# Patient Record
Sex: Female | Born: 1965 | Race: White | Hispanic: No | Smoking: Former smoker
Health system: Southern US, Community
[De-identification: ages and names within clinical notes are randomized; demographics above are authoritative.]

## PROBLEM LIST (undated history)

## (undated) DIAGNOSIS — Z89411 Acquired absence of right great toe: Secondary | ICD-10-CM

## (undated) DIAGNOSIS — E119 Type 2 diabetes mellitus without complications: Secondary | ICD-10-CM

## (undated) DIAGNOSIS — R259 Unspecified abnormal involuntary movements: Secondary | ICD-10-CM

## (undated) DIAGNOSIS — B019 Varicella without complication: Secondary | ICD-10-CM

## (undated) DIAGNOSIS — E785 Hyperlipidemia, unspecified: Secondary | ICD-10-CM

## (undated) DIAGNOSIS — E079 Disorder of thyroid, unspecified: Secondary | ICD-10-CM

## (undated) DIAGNOSIS — Z89519 Acquired absence of unspecified leg below knee: Secondary | ICD-10-CM

## (undated) DIAGNOSIS — I739 Peripheral vascular disease, unspecified: Secondary | ICD-10-CM

## (undated) DIAGNOSIS — Z0000 Encounter for general adult medical examination without abnormal findings: Secondary | ICD-10-CM

## (undated) DIAGNOSIS — IMO0002 Reserved for concepts with insufficient information to code with codable children: Secondary | ICD-10-CM

## (undated) DIAGNOSIS — Z89619 Acquired absence of unspecified leg above knee: Secondary | ICD-10-CM

## (undated) DIAGNOSIS — Z89429 Acquired absence of other toe(s), unspecified side: Secondary | ICD-10-CM

## (undated) DIAGNOSIS — N390 Urinary tract infection, site not specified: Secondary | ICD-10-CM

## (undated) DIAGNOSIS — E282 Polycystic ovarian syndrome: Secondary | ICD-10-CM

## (undated) HISTORY — DX: Type 2 diabetes mellitus without complications: E11.9

## (undated) HISTORY — DX: Acquired absence of unspecified leg below knee: Z89.519

## (undated) HISTORY — DX: Acquired absence of right great toe: Z89.411

## (undated) HISTORY — DX: Hyperlipidemia, unspecified: E78.5

## (undated) HISTORY — DX: Reserved for concepts with insufficient information to code with codable children: IMO0002

## (undated) HISTORY — DX: Encounter for general adult medical examination without abnormal findings: Z00.00

## (undated) HISTORY — DX: Peripheral vascular disease, unspecified: I73.9

## (undated) HISTORY — DX: Unspecified abnormal involuntary movements: R25.9

## (undated) HISTORY — DX: Acquired absence of other toe(s), unspecified side: Z89.429

## (undated) HISTORY — DX: Disorder of thyroid, unspecified: E07.9

## (undated) HISTORY — DX: Acquired absence of unspecified leg above knee: Z89.619

## (undated) HISTORY — DX: Urinary tract infection, site not specified: N39.0

## (undated) HISTORY — DX: Varicella without complication: B01.9

## (undated) HISTORY — DX: Polycystic ovarian syndrome: E28.2

## (undated) HISTORY — PX: VASCULAR SURGERY: SHX849

---

## 1997-10-03 DIAGNOSIS — E282 Polycystic ovarian syndrome: Secondary | ICD-10-CM

## 1997-10-03 HISTORY — PX: ABDOMINAL EXPLORATION SURGERY: SHX538

## 1997-10-03 HISTORY — DX: Polycystic ovarian syndrome: E28.2

## 1997-10-03 LAB — HM PAP SMEAR

## 1998-05-28 HISTORY — PX: ABDOMINAL HYSTERECTOMY: SHX81

## 2011-11-04 LAB — HM MAMMOGRAPHY: HM MAMMO: NORMAL

## 2013-10-08 ENCOUNTER — Ambulatory Visit: Payer: Self-pay | Admitting: Family Medicine

## 2013-10-11 ENCOUNTER — Encounter: Payer: Self-pay | Admitting: Family Medicine

## 2013-10-11 ENCOUNTER — Ambulatory Visit (INDEPENDENT_AMBULATORY_CARE_PROVIDER_SITE_OTHER): Payer: Managed Care, Other (non HMO) | Admitting: Family Medicine

## 2013-10-11 ENCOUNTER — Telehealth: Payer: Self-pay | Admitting: Family Medicine

## 2013-10-11 VITALS — BP 118/80 | HR 84 | Temp 98.2°F | Ht 62.75 in | Wt 181.1 lb

## 2013-10-11 DIAGNOSIS — K219 Gastro-esophageal reflux disease without esophagitis: Secondary | ICD-10-CM

## 2013-10-11 DIAGNOSIS — E282 Polycystic ovarian syndrome: Secondary | ICD-10-CM | POA: Insufficient documentation

## 2013-10-11 DIAGNOSIS — E079 Disorder of thyroid, unspecified: Secondary | ICD-10-CM

## 2013-10-11 DIAGNOSIS — E119 Type 2 diabetes mellitus without complications: Secondary | ICD-10-CM

## 2013-10-11 DIAGNOSIS — E785 Hyperlipidemia, unspecified: Secondary | ICD-10-CM

## 2013-10-11 DIAGNOSIS — B019 Varicella without complication: Secondary | ICD-10-CM | POA: Insufficient documentation

## 2013-10-11 DIAGNOSIS — M549 Dorsalgia, unspecified: Secondary | ICD-10-CM

## 2013-10-11 DIAGNOSIS — IMO0002 Reserved for concepts with insufficient information to code with codable children: Secondary | ICD-10-CM | POA: Insufficient documentation

## 2013-10-11 MED ORDER — INSULIN ASPART PROT & ASPART (70-30 MIX) 100 UNIT/ML PEN: 60.0000 [IU] | PEN_INJECTOR | Freq: Every day | SUBCUTANEOUS | Status: DC

## 2013-10-11 MED ORDER — HYDROCODONE-ACETAMINOPHEN 10-325 MG PO TABS: 1.0000 | ORAL_TABLET | Freq: Two times a day (BID) | ORAL | Status: DC | PRN

## 2013-10-11 MED ORDER — METFORMIN HCL ER (OSM) 1000 MG PO TB24: 1000.0000 mg | ORAL_TABLET | Freq: Two times a day (BID) | ORAL | Status: DC

## 2013-10-11 MED ORDER — DEXLANSOPRAZOLE 60 MG PO CPDR: 60.0000 mg | DELAYED_RELEASE_CAPSULE | Freq: Every day | ORAL | Status: DC

## 2013-10-11 NOTE — Telephone Encounter (Signed)
Next visit gyn, labs prior lipid, renal, cbc, tsh, hepatic, hgba1c, vitamin d at [facility] [place]

## 2013-10-11 NOTE — Patient Instructions (Signed)
Probiotic such as Digestive Advantage, Culturelle, Florastor, Align or a generic yogurt    Preventive Care for Adults, Female A healthy lifestyle and preventive care can promote health and wellness. Preventive health guidelines for women include the following key practices.  A routine yearly physical is a good way to check with your caregiver about your health and preventive screening. It is a chance to share any concerns and updates on your health, and to receive a thorough exam.  Visit your dentist for a routine exam and preventive care every 6 months. Brush your teeth twice a day and floss once a day. Good oral hygiene prevents tooth decay and gum disease.  The frequency of eye exams is based on your age, health, family medical history, use of contact lenses, and other factors. Follow your caregiver's recommendations for frequency of eye exams.  Eat a healthy diet. Foods like vegetables, fruits, whole grains, low-fat dairy products, and lean protein foods contain the nutrients you need without too many calories. Decrease your intake of foods high in solid fats, added sugars, and salt. Eat the right amount of calories for you.Get information about a proper diet from your caregiver, if necessary.  Regular physical exercise is one of the most important things you can do for your health. Most adults should get at least 150 minutes of moderate-intensity exercise (any activity that increases your heart rate and causes you to sweat) each week. In addition, most adults need muscle-strengthening exercises on 2 or more days a week.  Maintain a healthy weight. The body mass index (BMI) is a screening tool to identify possible weight problems. It provides an estimate of body fat based on height and weight. Your caregiver can help determine your BMI, and can help you achieve or maintain a healthy weight.For adults 20 years and older:  A BMI below 18.5 is considered underweight.  A BMI of 18.5 to 24.9  is normal.  A BMI of 25 to 29.9 is considered overweight.  A BMI of 30 and above is considered obese.  Maintain normal blood lipids and cholesterol levels by exercising and minimizing your intake of saturated fat. Eat a balanced diet with plenty of fruit and vegetables. Blood tests for lipids and cholesterol should begin at age 30 and be repeated every 5 years. If your lipid or cholesterol levels are high, you are over 50, or you are at high risk for heart disease, you may need your cholesterol levels checked more frequently.Ongoing high lipid and cholesterol levels should be treated with medicines if diet and exercise are not effective.  If you smoke, find out from your caregiver how to quit. If you do not use tobacco, do not start.  Lung cancer screening is recommended for adults aged 16 80 years who are at high risk for developing lung cancer because of a history of smoking. Yearly low-dose computed tomography (CT) is recommended for people who have at least a 30-pack-year history of smoking and are a current smoker or have quit within the past 15 years. A pack year of smoking is smoking an average of 1 pack of cigarettes a day for 1 year (for example: 1 pack a day for 30 years or 2 packs a day for 15 years). Yearly screening should continue until the smoker has stopped smoking for at least 15 years. Yearly screening should also be stopped for people who develop a health problem that would prevent them from having lung cancer treatment.  If you are pregnant, do not  drink alcohol. If you are breastfeeding, be very cautious about drinking alcohol. If you are not pregnant and choose to drink alcohol, do not exceed 1 drink per day. One drink is considered to be 12 ounces (355 mL) of beer, 5 ounces (148 mL) of wine, or 1.5 ounces (44 mL) of liquor.  Avoid use of street drugs. Do not share needles with anyone. Ask for help if you need support or instructions about stopping the use of drugs.  High blood  pressure causes heart disease and increases the risk of stroke. Your blood pressure should be checked at least every 1 to 2 years. Ongoing high blood pressure should be treated with medicines if weight loss and exercise are not effective.  If you are 38 to 50 years old, ask your caregiver if you should take aspirin to prevent strokes.  Diabetes screening involves taking a blood sample to check your fasting blood sugar level. This should be done once every 3 years, after age 71, if you are within normal weight and without risk factors for diabetes. Testing should be considered at a younger age or be carried out more frequently if you are overweight and have at least 1 risk factor for diabetes.  Breast cancer screening is essential preventive care for women. You should practice "breast self-awareness." This means understanding the normal appearance and feel of your breasts and may include breast self-examination. Any changes detected, no matter how small, should be reported to a caregiver. Women in their 73s and 30s should have a clinical breast exam (CBE) by a caregiver as part of a regular health exam every 1 to 3 years. After age 21, women should have a CBE every year. Starting at age 37, women should consider having a mammography (breast X-ray test) every year. Women who have a family history of breast cancer should talk to their caregiver about genetic screening. Women at a high risk of breast cancer should talk to their caregivers about having magnetic resonance imaging (MRI) and a mammography every year.  Breast cancer gene (BRCA)-related cancer risk assessment is recommended for women who have family members with BRCA-related cancers. BRCA-related cancers include breast, ovarian, tubal, and peritoneal cancers. Having family members with these cancers may be associated with an increased risk for harmful changes (mutations) in the breast cancer genes BRCA1 and BRCA2. Results of the assessment will  determine the need for genetic counseling and BRCA1 and BRCA2 testing.  The Pap test is a screening test for cervical cancer. A Pap test can show cell changes on the cervix that might become cervical cancer if left untreated. A Pap test is a procedure in which cells are obtained and examined from the lower end of the uterus (cervix).  Women should have a Pap test starting at age 49.  Between ages 59 and 32, Pap tests should be repeated every 2 years.  Beginning at age 40, you should have a Pap test every 3 years as long as the past 3 Pap tests have been normal.  Some women have medical problems that increase the chance of getting cervical cancer. Talk to your caregiver about these problems. It is especially important to talk to your caregiver if a new problem develops soon after your last Pap test. In these cases, your caregiver may recommend more frequent screening and Pap tests.  The above recommendations are the same for women who have or have not gotten the vaccine for human papillomavirus (HPV).  If you had a hysterectomy for  a problem that was not cancer or a condition that could lead to cancer, then you no longer need Pap tests. Even if you no longer need a Pap test, a regular exam is a good idea to make sure no other problems are starting.  If you are between ages 30 and 65, and you have had normal Pap tests going back 10 years, you no longer need Pap tests. Even if you no longer need a Pap test, a regular exam is a good idea to make sure no other problems are starting.  If you have had past treatment for cervical cancer or a condition that could lead to cancer, you need Pap tests and screening for cancer for at least 20 years after your treatment.  If Pap tests have been discontinued, risk factors (such as a new sexual partner) need to be reassessed to determine if screening should be resumed.  The HPV test is an additional test that may be used for cervical cancer screening. The HPV  test looks for the virus that can cause the cell changes on the cervix. The cells collected during the Pap test can be tested for HPV. The HPV test could be used to screen women aged 33 years and older, and should be used in women of any age who have unclear Pap test results. After the age of 37, women should have HPV testing at the same frequency as a Pap test.  Colorectal cancer can be detected and often prevented. Most routine colorectal cancer screening begins at the age of 38 and continues through age 75. However, your caregiver may recommend screening at an earlier age if you have risk factors for colon cancer. On a yearly basis, your caregiver may provide home test kits to check for hidden blood in the stool. Use of a small camera at the end of a tube, to directly examine the colon (sigmoidoscopy or colonoscopy), can detect the earliest forms of colorectal cancer. Talk to your caregiver about this at age 10, when routine screening begins. Direct examination of the colon should be repeated every 5 to 10 years through age 77, unless early forms of pre-cancerous polyps or small growths are found.  Hepatitis C blood testing is recommended for all people born from [birth year] through 1965 and any individual with known risks for hepatitis C.  Practice safe sex. Use condoms and avoid high-risk sexual practices to reduce the spread of sexually transmitted infections (STIs). STIs include gonorrhea, chlamydia, syphilis, trichomonas, herpes, HPV, and human immunodeficiency virus (HIV). Herpes, HIV, and HPV are viral illnesses that have no cure. They can result in disability, cancer, and death. Sexually active women aged 41 and younger should be checked for chlamydia. Older women with new or multiple partners should also be tested for chlamydia. Testing for other STIs is recommended if you are sexually active and at increased risk.  Osteoporosis is a disease in which the bones lose minerals and strength with aging.  This can result in serious bone fractures. The risk of osteoporosis can be identified using a bone density scan. Women ages 61 and over and women at risk for fractures or osteoporosis should discuss screening with their caregivers. Ask your caregiver whether you should take a calcium supplement or vitamin D to reduce the rate of osteoporosis.  Menopause can be associated with physical symptoms and risks. Hormone replacement therapy is available to decrease symptoms and risks. You should talk to your caregiver about whether hormone replacement therapy is right for you.  Use sunscreen. Apply sunscreen liberally and repeatedly throughout the day. You should seek shade when your shadow is shorter than you. Protect yourself by wearing long sleeves, pants, a wide-brimmed hat, and sunglasses year round, whenever you are outdoors.  Once a month, do a whole body skin exam, using a mirror to look at the skin on your back. Notify your caregiver of new moles, moles that have irregular borders, moles that are larger than a pencil eraser, or moles that have changed in shape or color.  Stay current with required immunizations.  Influenza vaccine. All adults should be immunized every year.  Tetanus, diphtheria, and acellular pertussis (Td, Tdap) vaccine. Pregnant women should receive 1 dose of Tdap vaccine during each pregnancy. The dose should be obtained regardless of the length of time since the last dose. Immunization is preferred during the 27th to 36th week of gestation. An adult who has not previously received Tdap or who does not know her vaccine status should receive 1 dose of Tdap. This initial dose should be followed by tetanus and diphtheria toxoids (Td) booster doses every 10 years. Adults with an unknown or incomplete history of completing a 3-dose immunization series with Td-containing vaccines should begin or complete a primary immunization series including a Tdap dose. Adults should receive a Td booster  every 10 years.  Varicella vaccine. An adult without evidence of immunity to varicella should receive 2 doses or a second dose if she has previously received 1 dose. Pregnant females who do not have evidence of immunity should receive the first dose after pregnancy. This first dose should be obtained before leaving the health care facility. The second dose should be obtained 4 8 weeks after the first dose.  Human papillomavirus (HPV) vaccine. Females aged 47 26 years who have not received the vaccine previously should obtain the 3-dose series. The vaccine is not recommended for use in pregnant females. However, pregnancy testing is not needed before receiving a dose. If a female is found to be pregnant after receiving a dose, no treatment is needed. In that case, the remaining doses should be delayed until after the pregnancy. Immunization is recommended for any person with an immunocompromised condition through the age of 76 years if she did not get any or all doses earlier. During the 3-dose series, the second dose should be obtained 4 8 weeks after the first dose. The third dose should be obtained 24 weeks after the first dose and 16 weeks after the second dose.  Zoster vaccine. One dose is recommended for adults aged 42 years or older unless certain conditions are present.  Measles, mumps, and rubella (MMR) vaccine. Adults born before [birth year] generally are considered immune to measles and mumps. Adults born in [birth year] or later should have 1 or more doses of MMR vaccine unless there is a contraindication to the vaccine or there is laboratory evidence of immunity to each of the three diseases. A routine second dose of MMR vaccine should be obtained at least 28 days after the first dose for students attending postsecondary schools, health care workers, or international travelers. People who received inactivated measles vaccine or an unknown type of measles vaccine during 1963 1967 should receive 2 doses of MMR  vaccine. People who received inactivated mumps vaccine or an unknown type of mumps vaccine before 1979 and are at high risk for mumps infection should consider immunization with 2 doses of MMR vaccine. For females of childbearing age, rubella immunity should be determined. If there is  no evidence of immunity, females who are not pregnant should be vaccinated. If there is no evidence of immunity, females who are pregnant should delay immunization until after pregnancy. Unvaccinated health care workers born before [birth year] who lack laboratory evidence of measles, mumps, or rubella immunity or laboratory confirmation of disease should consider measles and mumps immunization with 2 doses of MMR vaccine or rubella immunization with 1 dose of MMR vaccine.  Pneumococcal 13-valent conjugate (PCV13) vaccine. When indicated, a person who is uncertain of her immunization history and has no record of immunization should receive the PCV13 vaccine. An adult aged 74 years or older who has certain medical conditions and has not been previously immunized should receive 1 dose of PCV13 vaccine. This PCV13 should be followed with a dose of pneumococcal polysaccharide (PPSV23) vaccine. The PPSV23 vaccine dose should be obtained at least 8 weeks after the dose of PCV13 vaccine. An adult aged 33 years or older who has certain medical conditions and previously received 1 or more doses of PPSV23 vaccine should receive 1 dose of PCV13. The PCV13 vaccine dose should be obtained 1 or more years after the last PPSV23 vaccine dose.  Pneumococcal polysaccharide (PPSV23) vaccine. When PCV13 is also indicated, PCV13 should be obtained first. All adults aged 20 years and older should be immunized. An adult younger than age 35 years who has certain medical conditions should be immunized. Any person who resides in a nursing home or long-term care facility should be immunized. An adult smoker should be immunized. People with an immunocompromised  condition and certain other conditions should receive both PCV13 and PPSV23 vaccines. People with human immunodeficiency virus (HIV) infection should be immunized as soon as possible after diagnosis. Immunization during chemotherapy or radiation therapy should be avoided. Routine use of PPSV23 vaccine is not recommended for American Indians, [place] Natives, or people younger than 65 years unless there are medical conditions that require PPSV23 vaccine. When indicated, people who have unknown immunization and have no record of immunization should receive PPSV23 vaccine. One-time revaccination 5 years after the first dose of PPSV23 is recommended for people aged 42 64 years who have chronic kidney failure, nephrotic syndrome, asplenia, or immunocompromised conditions. People who received 1 2 doses of PPSV23 before age 79 years should receive another dose of PPSV23 vaccine at age 45 years or later if at least 5 years have passed since the previous dose. Doses of PPSV23 are not needed for people immunized with PPSV23 at or after age 49 years.  Meningococcal vaccine. Adults with asplenia or persistent complement component deficiencies should receive 2 doses of quadrivalent meningococcal conjugate (MenACWY-D) vaccine. The doses should be obtained at least 2 months apart. Microbiologists working with certain meningococcal bacteria, [place] recruits, people at risk during an outbreak, and people who travel to or live in countries with a high rate of meningitis should be immunized. A first-year college student up through age 35 years who is living in a residence hall should receive a dose if she did not receive a dose on or after her 16th birthday. Adults who have certain high-risk conditions should receive one or more doses of vaccine.  Hepatitis A vaccine. Adults who wish to be protected from this disease, have certain high-risk conditions, work with hepatitis A-infected animals, work in hepatitis A research labs, or  travel to or work in countries with a high rate of hepatitis A should be immunized. Adults who were previously unvaccinated and who anticipate close contact with an international adoptee  during the first 60 days after arrival in the [place] from a country with a high rate of hepatitis A should be immunized.  Hepatitis B vaccine. Adults who wish to be protected from this disease, have certain high-risk conditions, may be exposed to blood or other infectious body fluids, are household contacts or sex partners of hepatitis B positive people, are clients or workers in certain care facilities, or travel to or work in countries with a high rate of hepatitis B should be immunized.  Haemophilus influenzae type b (Hib) vaccine. A previously unvaccinated person with asplenia or sickle cell disease or having a scheduled splenectomy should receive 1 dose of Hib vaccine. Regardless of previous immunization, a recipient of a hematopoietic stem cell transplant should receive a 3-dose series 6 12 months after her successful transplant. Hib vaccine is not recommended for adults with HIV infection. Preventive Services / Frequency Ages 39 to 72  Blood pressure check.** / Every 1 to 2 years.  Lipid and cholesterol check.** / Every 5 years beginning at age 92.  Clinical breast exam.** / Every 3 years for women in their 66s and 50s.  BRCA-related cancer risk assessment.** / For women who have family members with a BRCA-related cancer (breast, ovarian, tubal, or peritoneal cancers).  Pap test.** / Every 2 years from ages 47 through 3. Every 3 years starting at age 50 through age 32 or 44 with a history of 3 consecutive normal Pap tests.  HPV screening.** / Every 3 years from ages 28 through ages 77 to 68 with a history of 3 consecutive normal Pap tests.  Hepatitis C blood test.** / For any individual with known risks for hepatitis C.  Skin self-exam. / Monthly.  Influenza vaccine. / Every year.  Tetanus,  diphtheria, and acellular pertussis (Tdap, Td) vaccine.** / Consult your caregiver. Pregnant women should receive 1 dose of Tdap vaccine during each pregnancy. 1 dose of Td every 10 years.  Varicella vaccine.** / Consult your caregiver. Pregnant females who do not have evidence of immunity should receive the first dose after pregnancy.  HPV vaccine. / 3 doses over 6 months, if 2 and younger. The vaccine is not recommended for use in pregnant females. However, pregnancy testing is not needed before receiving a dose.  Measles, mumps, rubella (MMR) vaccine.** / You need at least 1 dose of MMR if you were born in 1957 or later. You may also need a 2nd dose. For females of childbearing age, rubella immunity should be determined. If there is no evidence of immunity, females who are not pregnant should be vaccinated. If there is no evidence of immunity, females who are pregnant should delay immunization until after pregnancy.  Pneumococcal 13-valent conjugate (PCV13) vaccine.** / Consult your caregiver.  Pneumococcal polysaccharide (PPSV23) vaccine.** / 1 to 2 doses if you smoke cigarettes or if you have certain conditions.  Meningococcal vaccine.** / 1 dose if you are age 31 to 87 years and a Market researcher living in a residence hall, or have one of several medical conditions, you need to get vaccinated against meningococcal disease. You may also need additional booster doses.  Hepatitis A vaccine.** / Consult your caregiver.  Hepatitis B vaccine.** / Consult your caregiver.  Haemophilus influenzae type b (Hib) vaccine.** / Consult your caregiver. Ages 59 to 58  Blood pressure check.** / Every 1 to 2 years.  Lipid and cholesterol check.** / Every 5 years beginning at age 79.  Lung cancer screening. / Every year if  you are aged 46 80 years and have a 30-pack-year history of smoking and currently smoke or have quit within the past 15 years. Yearly screening is stopped once you have quit  smoking for at least 15 years or develop a health problem that would prevent you from having lung cancer treatment.  Clinical breast exam.** / Every year after age 36.  BRCA-related cancer risk assessment.** / For women who have family members with a BRCA-related cancer (breast, ovarian, tubal, or peritoneal cancers).  Mammogram.** / Every year beginning at age 18 and continuing for as long as you are in good health. Consult with your caregiver.  Pap test.** / Every 3 years starting at age 77 through age 97 or 88 with a history of 3 consecutive normal Pap tests.  HPV screening.** / Every 3 years from ages 18 through ages 40 to 29 with a history of 3 consecutive normal Pap tests.  Fecal occult blood test (FOBT) of stool. / Every year beginning at age 88 and continuing until age 60. You may not need to do this test if you get a colonoscopy every 10 years.  Flexible sigmoidoscopy or colonoscopy.** / Every 5 years for a flexible sigmoidoscopy or every 10 years for a colonoscopy beginning at age 43 and continuing until age 69.  Hepatitis C blood test.** / For all people born from [birth year] through 1967 and any individual with known risks for hepatitis C.  Skin self-exam. / Monthly.  Influenza vaccine. / Every year.  Tetanus, diphtheria, and acellular pertussis (Tdap/Td) vaccine.** / Consult your caregiver. Pregnant women should receive 1 dose of Tdap vaccine during each pregnancy. 1 dose of Td every 10 years.  Varicella vaccine.** / Consult your caregiver. Pregnant females who do not have evidence of immunity should receive the first dose after pregnancy.  Zoster vaccine.** / 1 dose for adults aged 58 years or older.  Measles, mumps, rubella (MMR) vaccine.** / You need at least 1 dose of MMR if you were born in 1957 or later. You may also need a 2nd dose. For females of childbearing age, rubella immunity should be determined. If there is no evidence of immunity, females who are not pregnant should  be vaccinated. If there is no evidence of immunity, females who are pregnant should delay immunization until after pregnancy.  Pneumococcal 13-valent conjugate (PCV13) vaccine.** / Consult your caregiver.  Pneumococcal polysaccharide (PPSV23) vaccine.** / 1 to 2 doses if you smoke cigarettes or if you have certain conditions.  Meningococcal vaccine.** / Consult your caregiver.  Hepatitis A vaccine.** / Consult your caregiver.  Hepatitis B vaccine.** / Consult your caregiver.  Haemophilus influenzae type b (Hib) vaccine.** / Consult your caregiver. Ages 39 and over  Blood pressure check.** / Every 1 to 2 years.  Lipid and cholesterol check.** / Every 5 years beginning at age 21.  Lung cancer screening. / Every year if you are aged 91 80 years and have a 30-pack-year history of smoking and currently smoke or have quit within the past 15 years. Yearly screening is stopped once you have quit smoking for at least 15 years or develop a health problem that would prevent you from having lung cancer treatment.  Clinical breast exam.** / Every year after age 28.  BRCA-related cancer risk assessment.** / For women who have family members with a BRCA-related cancer (breast, ovarian, tubal, or peritoneal cancers).  Mammogram.** / Every year beginning at age 90 and continuing for as long as you are in good health.  Consult with your caregiver.  Pap test.** / Every 3 years starting at age 13 through age 33 or 48 with a 3 consecutive normal Pap tests. Testing can be stopped between 65 and 70 with 3 consecutive normal Pap tests and no abnormal Pap or HPV tests in the past 10 years.  HPV screening.** / Every 3 years from ages 37 through ages 83 or 59 with a history of 3 consecutive normal Pap tests. Testing can be stopped between 65 and 70 with 3 consecutive normal Pap tests and no abnormal Pap or HPV tests in the past 10 years.  Fecal occult blood test (FOBT) of stool. / Every year beginning at age 42  and continuing until age 77. You may not need to do this test if you get a colonoscopy every 10 years.  Flexible sigmoidoscopy or colonoscopy.** / Every 5 years for a flexible sigmoidoscopy or every 10 years for a colonoscopy beginning at age 40 and continuing until age 6.  Hepatitis C blood test.** / For all people born from [birth year] through 1967 and any individual with known risks for hepatitis C.  Osteoporosis screening.** / A one-time screening for women ages 32 and over and women at risk for fractures or osteoporosis.  Skin self-exam. / Monthly.  Influenza vaccine. / Every year.  Tetanus, diphtheria, and acellular pertussis (Tdap/Td) vaccine.** / 1 dose of Td every 10 years.  Varicella vaccine.** / Consult your caregiver.  Zoster vaccine.** / 1 dose for adults aged 73 years or older.  Pneumococcal 13-valent conjugate (PCV13) vaccine.** / Consult your caregiver.  Pneumococcal polysaccharide (PPSV23) vaccine.** / 1 dose for all adults aged 60 years and older.  Meningococcal vaccine.** / Consult your caregiver.  Hepatitis A vaccine.** / Consult your caregiver.  Hepatitis B vaccine.** / Consult your caregiver.  Haemophilus influenzae type b (Hib) vaccine.** / Consult your caregiver. ** Family history and personal history of risk and conditions may change your caregiver's recommendations. Document Released: 11/15/2001 Document Revised: 01/14/2013 Document Reviewed: 02/14/2011 [facility] Patient Information 2014 [place], [place].

## 2013-10-11 NOTE — Progress Notes (Signed)
Pre visit review using our clinic review tool, if applicable. No additional management support is needed unless otherwise documented below in the visit note. 

## 2013-10-11 NOTE — Telephone Encounter (Signed)
Labs ordered.

## 2013-10-13 ENCOUNTER — Encounter: Payer: Self-pay | Admitting: Family Medicine

## 2013-10-13 DIAGNOSIS — K219 Gastro-esophageal reflux disease without esophagitis: Secondary | ICD-10-CM | POA: Insufficient documentation

## 2013-10-13 DIAGNOSIS — E119 Type 2 diabetes mellitus without complications: Secondary | ICD-10-CM

## 2013-10-13 HISTORY — DX: Type 2 diabetes mellitus without complications: E11.9

## 2013-10-13 NOTE — Assessment & Plan Note (Signed)
encouraged probiotics, avoid offending foods and continue Dexilant, has failed Nexium, Zantac and Prilosec none of which worked

## 2013-10-13 NOTE — Assessment & Plan Note (Signed)
Continue levothyroxine and check tsh with next blood draw

## 2013-10-13 NOTE — Assessment & Plan Note (Signed)
Patient reports history of hgba1c of 2211, continue current meds, obtain old records and repeat hgba1c

## 2013-10-13 NOTE — Assessment & Plan Note (Signed)
Avoid trans fats, minimize simple carbs and saturated fats continue fish oil caps til check lipid panel

## 2013-10-13 NOTE — Progress Notes (Signed)
Patient ID: Dawn Cabrera, female   DOB: 1966/01/06, 48 y.o.   MRN: [id number] Dawn Cabrera [id number] [birth year] 10/13/2013      Progress Note New Patient  Subjective  Chief Complaint  Chief Complaint  Patient presents with  . Establish Care    new patient    HPI  Patient is a 48 year old female who is in establish care. She has chronic GI issues. Reports a history of a ulcer. She's failed Nexium, Prilosec and Zantac at this time is helping. She struggles with diabetes but has not had blood work in some time. Has had a mammogram within the last year. No recent illness. No chest pain or palpitations. No shortness of breath or GU complaints noted. Taking medications as below.  Past Medical History  Diagnosis Date  . Hyperlipidemia   . Thyroid disease   . Ulcer   . Diabetes mellitus without complication 10 years    type 2   . Chicken pox as child  . Polycystic ovarian syndrome 99  . DDD (degenerative disc disease)     low back pain  . Diabetes 10/13/2013    Past Surgical History  Procedure Laterality Date  . Abdominal hysterectomy  05-28-98    total  . Cesarean section  10-10-85  . Abdominal exploration surgery  1999    before hysterectomy    Family History  Problem Relation Age of Onset  . COPD Mother 58  . Diabetes Mother     type 2  . Alcohol abuse Mother   . Thyroid disease Daughter   . Cancer Maternal Grandmother     unsure  . Alcohol abuse Maternal Grandmother   . Alcohol abuse Father   . Alcohol abuse Brother   . Alcohol abuse Maternal Grandfather   . Alcohol abuse Brother   . Alcohol abuse Brother     History   Social History  . Marital Status: Divorced    Spouse Name: N/A    Number of Children: N/A  . Years of Education: N/A   Occupational History  . Not on file.   Social History Main Topics  . Smoking status: Current Every Day Smoker -- 0.50 packs/day for 34 years    Types: Cigarettes  . Smokeless tobacco: Never Used  . Alcohol Use: No   . Drug Use: No  . Sexual Activity: Yes    Partners: Male   Other Topics Concern  . Not on file   Social History Narrative  . No narrative on file    No current outpatient prescriptions on file prior to visit.   No current facility-administered medications on file prior to visit.    No Known Allergies  Review of Systems  Review of Systems  Constitutional: Negative for fever, chills and malaise/fatigue.  HENT: Negative for congestion, hearing loss and nosebleeds.   Eyes: Negative for discharge.  Respiratory: Negative for cough, sputum production, shortness of breath and wheezing.   Cardiovascular: Negative for chest pain, palpitations and leg swelling.  Gastrointestinal: Positive for heartburn, nausea and abdominal pain. Negative for vomiting, diarrhea, constipation and blood in stool.  Genitourinary: Negative for dysuria, urgency, frequency and hematuria.  Musculoskeletal: Negative for back pain, falls and myalgias.  Skin: Negative for rash.  Neurological: Negative for dizziness, tremors, sensory change, focal weakness, loss of consciousness, weakness and headaches.  Endo/Heme/Allergies: Negative for polydipsia. Does not bruise/bleed easily.  Psychiatric/Behavioral: Negative for depression and suicidal ideas. The patient is not nervous/anxious and does not have insomnia.  Objective  BP 118/80  Pulse 84  Temp(Src) 98.2 F (36.8 C) (Oral)  Ht 5' 2.75" (1.594 m)  Wt 181 lb 1.3 oz (82.137 kg)  BMI 32.33 kg/m2  SpO2 99%  LMP 05/28/1998  Physical Exam  Physical Exam  Constitutional: She is oriented to person, place, and time and well-developed, well-nourished, and in no distress. No distress.  HENT:  Head: Normocephalic and atraumatic.  Right Ear: External ear normal.  Left Ear: External ear normal.  Nose: Nose normal.  Mouth/Throat: Oropharynx is clear and moist. No oropharyngeal exudate.  Eyes: Conjunctivae are normal. Pupils are equal, round, and reactive to  light. Right eye exhibits no discharge. Left eye exhibits no discharge. No scleral icterus.  Neck: Normal range of motion. Neck supple. No thyromegaly present.  Cardiovascular: Normal rate, regular rhythm, normal heart sounds and intact distal pulses.   No murmur heard. Pulmonary/Chest: Effort normal and breath sounds normal. No respiratory distress. She has no wheezes. She has no rales.  Abdominal: Soft. Bowel sounds are normal. She exhibits no distension and no mass. There is no tenderness.  Musculoskeletal: Normal range of motion. She exhibits no edema and no tenderness.  Lymphadenopathy:    She has no cervical adenopathy.  Neurological: She is alert and oriented to person, place, and time. She has normal reflexes. No cranial nerve deficit. Coordination normal.  Skin: Skin is warm and dry. No rash noted. She is not diaphoretic.  Psychiatric: Mood, memory and affect normal.       Assessment & Plan  Diabetes Patient reports history of hgba1c of 11, continue current meds, obtain old records and repeat hgba1c   Thyroid disease Continue levothyroxine and check tsh with next blood draw  Hyperlipidemia Avoid trans fats, minimize simple carbs and saturated fats continue fish oil caps til check lipid panel   Gastro-esophageal reflux encouraged probiotics, avoid offending foods and continue Dexilant, has failed Nexium, Zantac and Prilosec none of which worked

## 2013-10-14 ENCOUNTER — Ambulatory Visit: Payer: Self-pay | Admitting: Family Medicine

## 2013-10-14 ENCOUNTER — Other Ambulatory Visit: Payer: Self-pay | Admitting: Family Medicine

## 2013-10-14 ENCOUNTER — Telehealth: Payer: Self-pay | Admitting: Family Medicine

## 2013-10-14 NOTE — Telephone Encounter (Signed)
So she should check prices at [facility] and we should look for coupons. I think we have a Novolog and Glumetza coupons which is another long acting insulin, see if those will help her.

## 2013-10-14 NOTE — Telephone Encounter (Signed)
Please advise 

## 2013-10-14 NOTE — Telephone Encounter (Signed)
Got rx for metformin and novolog.  They together would be over $300 per month.  She cant afford that.  Can you recommend something else

## 2013-10-15 ENCOUNTER — Telehealth: Payer: Self-pay

## 2013-10-15 NOTE — Telephone Encounter (Signed)
Relevant patient education assigned to patient using Emmi. ° °

## 2013-10-16 NOTE — Telephone Encounter (Signed)
Left detailed message on cell#, placed co-pay cards at front desk for Novolog and Glumetza and to call if any questions.

## 2013-10-21 ENCOUNTER — Telehealth: Payer: Self-pay | Admitting: Family Medicine

## 2013-10-21 NOTE — Telephone Encounter (Signed)
[facility]

## 2013-12-18 HISTORY — PX: TOE AMPUTATION: SHX809

## 2013-12-30 ENCOUNTER — Ambulatory Visit (INDEPENDENT_AMBULATORY_CARE_PROVIDER_SITE_OTHER): Payer: Managed Care, Other (non HMO) | Admitting: Family Medicine

## 2013-12-30 ENCOUNTER — Encounter: Payer: Self-pay | Admitting: Family Medicine

## 2013-12-30 VITALS — BP 104/66 | HR 91 | Temp 98.2°F | Ht 62.75 in | Wt 184.1 lb

## 2013-12-30 DIAGNOSIS — E119 Type 2 diabetes mellitus without complications: Secondary | ICD-10-CM

## 2013-12-30 DIAGNOSIS — I739 Peripheral vascular disease, unspecified: Secondary | ICD-10-CM

## 2013-12-30 DIAGNOSIS — M549 Dorsalgia, unspecified: Secondary | ICD-10-CM

## 2013-12-30 DIAGNOSIS — Z89429 Acquired absence of other toe(s), unspecified side: Secondary | ICD-10-CM

## 2013-12-30 DIAGNOSIS — S98139A Complete traumatic amputation of one unspecified lesser toe, initial encounter: Secondary | ICD-10-CM

## 2013-12-30 MED ORDER — HYDROCODONE-ACETAMINOPHEN 10-325 MG PO TABS: 1.0000 | ORAL_TABLET | Freq: Two times a day (BID) | ORAL | Status: DC | PRN

## 2013-12-30 NOTE — Progress Notes (Signed)
Pre visit review using our clinic review tool, if applicable. No additional management support is needed unless otherwise documented below in the visit note. 

## 2013-12-30 NOTE — Patient Instructions (Addendum)

## 2013-12-31 ENCOUNTER — Telehealth: Payer: Self-pay | Admitting: Family Medicine

## 2013-12-31 NOTE — Telephone Encounter (Signed)
Relevant patient education assigned to patient using Emmi. ° °

## 2014-01-01 ENCOUNTER — Encounter: Payer: Self-pay | Admitting: Family Medicine

## 2014-01-01 DIAGNOSIS — M549 Dorsalgia, unspecified: Secondary | ICD-10-CM | POA: Insufficient documentation

## 2014-01-01 DIAGNOSIS — Z89429 Acquired absence of other toe(s), unspecified side: Secondary | ICD-10-CM | POA: Insufficient documentation

## 2014-01-01 DIAGNOSIS — I739 Peripheral vascular disease, unspecified: Secondary | ICD-10-CM

## 2014-01-01 HISTORY — DX: Peripheral vascular disease, unspecified: I73.9

## 2014-01-01 HISTORY — DX: Acquired absence of other toe(s), unspecified side: Z89.429

## 2014-01-01 NOTE — Assessment & Plan Note (Signed)
Encouraged to minimize simple carbs

## 2014-01-01 NOTE — Assessment & Plan Note (Addendum)
Had a traumatic injury to her 3rd toe when she kicked a door and a toenail on her 4th toe scratched it. She developed a severe infection and is now s/p amputaion on IV and PO antibiotics. She is not sure the naem of the IV but the po is Ciprofloxacin. She has a Picc line in place and is following with ID, gen and vascualr surgery. Will [facility] all took place

## 2014-01-01 NOTE — Progress Notes (Signed)
Patient ID: Dawn Cabrera, female   DOB: [birth year], 48 y.o.   MRN: [id number] Dawn Cabrera [id number] Jan 28, 1966 01/01/2014      Progress Note-Follow Up  Subjective  Chief Complaint  Chief Complaint  Patient presents with  . toe amputation    hospital follow up.    HPI  Patient is a 48 year old female in today for routine medical care. Had a traumatic injury to her 3rd toe when she kicked a door and a toenail on her 4th toe scratched it. She developed a severe infection and is now s/p amputaion on IV and PO antibiotics. She is not sure the naem of the IV but the po is Ciprofloxacin. She has a Picc line in place and is following with ID, gen and vascualr surgery. She saw her general surgeon today and they are happy with healing. She is following with vascular and ID as well since being discharged from hospital. She notes persistent pain but it is improving. Since leaving the hospital she struggles with fatigue but denies any fevers. In the hospital vascular study showed significant atherosclerosis in the right leg requiring 3 stents types the left leg was clear. She reports her blood sugars were high in the hospital but are improving since being home. Denies CP/palp/SOB/HA/congestion/fevers/GI or GU c/o. Taking meds as prescribed  Past Medical History  Diagnosis Date  . Hyperlipidemia   . Thyroid disease   . Ulcer   . Diabetes mellitus without complication 10 years    type 2   . Chicken pox as child  . Polycystic ovarian syndrome 99  . DDD (degenerative disc disease)     low back pain  . Diabetes 10/13/2013  . PVD (peripheral vascular disease) 01/01/2014    Right leg S/p 3 stents in right leg at [facility] in March of 2015  . 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 Abdominal hysterectomy  05-28-98    total  . Cesarean section  10-10-85  . Abdominal exploration surgery  1999    before hysterectomy  . Toe amputation Right  12/18/13    Family History  Problem Relation Age of Onset  . COPD Mother 65  . Diabetes Mother     type 2  . Alcohol abuse Mother   . Thyroid disease Daughter   . Cancer Maternal Grandmother     unsure  . Alcohol abuse Maternal Grandmother   . Alcohol abuse Father   . Alcohol abuse Brother   . Alcohol abuse Maternal Grandfather   . Alcohol abuse Brother   . Alcohol abuse Brother     History   Social History  . Marital Status: Divorced    Spouse Name: N/A    Number of Children: N/A  . Years of Education: N/A   Occupational History  . Not on file.   Social History Main Topics  . Smoking status: Current Every Day Smoker -- 0.50 packs/day for 34 years    Types: Cigarettes  . Smokeless tobacco: Never Used  . Alcohol Use: No  . Drug Use: No  . Sexual Activity: Yes    Partners: Male   Other Topics Concern  . Not on file   Social History Narrative  . No narrative on file    Current Outpatient Prescriptions on File Prior to Visit  Medication Sig Dispense Refill  . aspirin 81 MG tablet Take 81 mg by mouth daily.      Marland Kitchen  dexlansoprazole (DEXILANT) 60 MG capsule Take 1 capsule (60 mg total) by mouth daily. Pt has tried and failed Nexium, Zantac, Prilosec  30 capsule  5  . ergocalciferol (VITAMIN D2) 50000 UNITS capsule Take 50,000 Units by mouth once a week.      . fenofibrate 160 MG tablet Take 160 mg by mouth daily.      . Insulin Aspart Prot & Aspart (NOVOLOG MIX 70/30 FLEXPEN) (70-30) 100 UNIT/ML Pen Inject 60 Units into the skin daily.  15 mL  3  . levothyroxine (SYNTHROID, LEVOTHROID) 200 MCG tablet Take 200 mcg by mouth daily before breakfast.      . Magnesium Oxide -Mg Supplement 250 MG TABS Take 1 tablet by mouth daily.      . metformin (FORTAMET) 1000 MG (OSM) 24 hr tablet Take 1 tablet (1,000 mg total) by mouth 2 (two) times daily with a meal.  60 tablet  3  . metoCLOPramide (REGLAN) 5 MG tablet Take 5 mg by mouth daily.      . Omega-3 Fatty Acids (FISH OIL)  1000 MG CAPS Take 2 capsules by mouth daily.      . saxagliptin HCl (ONGLYZA) 5 MG TABS tablet Take 5 mg by mouth daily.      . sucralfate (CARAFATE) 1 G tablet Take 1 g by mouth 4 (four) times daily.       No current facility-administered medications on file prior to visit.    No Known Allergies  Review of Systems  Review of Systems  Unable to perform ROS Constitutional: Positive for malaise/fatigue. Negative for fever.  HENT: Negative for congestion.   Eyes: Negative for discharge.  Respiratory: Negative for shortness of breath.   Cardiovascular: Negative for chest pain, palpitations and leg swelling.  Gastrointestinal: Negative for nausea, abdominal pain and diarrhea.  Genitourinary: Negative for dysuria.  Musculoskeletal: Positive for joint pain and myalgias. Negative for falls.  Skin: Negative for rash.  Neurological: Negative for loss of consciousness and headaches.  Endo/Heme/Allergies: Negative for polydipsia.  Psychiatric/Behavioral: Negative for depression and suicidal ideas. The patient is not nervous/anxious and does not have insomnia.     Objective  BP 104/66  Pulse 91  Temp(Src) 98.2 F (36.8 C) (Oral)  Ht 5' 2.75" (1.594 m)  Wt 184 lb 1.9 oz (83.516 kg)  BMI 32.87 kg/m2  SpO2 97%  LMP 05/28/1998  Physical Exam  Physical Exam  Constitutional: She is oriented to person, place, and time and well-developed, well-nourished, and in no distress. No distress.  HENT:  Head: Normocephalic and atraumatic.  Eyes: Conjunctivae are normal.  Neck: Neck supple. No thyromegaly present.  Cardiovascular: Normal rate, regular rhythm and normal heart sounds.   No murmur heard. Pulmonary/Chest: Effort normal and breath sounds normal. She has no wheezes.  Abdominal: She exhibits no distension and no mass.  Musculoskeletal: She exhibits no edema.  Right foot bandaged in surgical boot  Lymphadenopathy:    She has no cervical adenopathy.  Neurological: She is alert and  oriented to person, place, and time.  Skin: Skin is warm and dry. No rash noted. She is not diaphoretic.  Psychiatric: Memory, affect and judgment normal.     Assessment & Plan  S/P amputation of lesser toe Had a traumatic injury to her 3rd toe when she kicked a door and a toenail on her 4th toe scratched it. She developed a severe infection and is now s/p amputaion on IV and PO antibiotics. She is not sure the naem of the  IV but the po is Ciprofloxacin. She has a Picc line in place and is following with ID, gen and vascualr surgery. Will request records for [facility] where this all took place  PVD (peripheral vascular disease) No lesions in left leg per patient will request records  Diabetes Encouraged to minimize simple carbs

## 2014-01-01 NOTE — Assessment & Plan Note (Signed)
No lesions in left leg per patient will request records

## 2014-01-24 ENCOUNTER — Telehealth: Payer: Self-pay | Admitting: *Deleted

## 2014-01-24 DIAGNOSIS — M549 Dorsalgia, unspecified: Secondary | ICD-10-CM

## 2014-01-24 NOTE — Telephone Encounter (Signed)
Ok to refil Oxycodone

## 2014-01-24 NOTE — Telephone Encounter (Signed)
Pt left message requesting refill of hydrocodone. Last rx printed 12/30/13, #60. Pt states she knows it is 1 week early but states she had to take extra a few days. Advised pt that I do not have a Provider in the office at this time that can address this and it will be Monday before we can let her know the outcome.  Pt voices understanding.

## 2014-01-27 MED ORDER — HYDROCODONE-ACETAMINOPHEN 10-325 MG PO TABS: 1.0000 | ORAL_TABLET | Freq: Two times a day (BID) | ORAL | Status: DC | PRN

## 2014-01-27 MED ORDER — FLUCONAZOLE 150 MG PO TABS: 150.0000 mg | ORAL_TABLET | Freq: Once | ORAL | Status: DC

## 2014-01-27 NOTE — Telephone Encounter (Signed)
RX sent and patient informed.  

## 2014-01-27 NOTE — Telephone Encounter (Signed)
Can have diflucan 150 mg 1 tab weeklty x 2 wk and 1 rf

## 2014-01-27 NOTE — Telephone Encounter (Signed)
RX (Hydrocodone)printed for md to sign.  Pt informed and also states the infectious disease doctor put her on Amoxicillin and she would like something for a yeast infection called into the pharmacy.  Please advise?

## 2014-02-05 ENCOUNTER — Ambulatory Visit (INDEPENDENT_AMBULATORY_CARE_PROVIDER_SITE_OTHER): Payer: Managed Care, Other (non HMO) | Admitting: Physician Assistant

## 2014-02-05 ENCOUNTER — Encounter: Payer: Self-pay | Admitting: Physician Assistant

## 2014-02-05 ENCOUNTER — Ambulatory Visit (HOSPITAL_BASED_OUTPATIENT_CLINIC_OR_DEPARTMENT_OTHER)
Admission: RE | Admit: 2014-02-05 | Discharge: 2014-02-05 | Disposition: A | Discharge status: Home / Self Care | Payer: 59 | Source: Ambulatory Visit | Attending: Physician Assistant | Admitting: Physician Assistant

## 2014-02-05 VITALS — BP 108/76 | HR 85 | Temp 97.7°F | Resp 16 | Ht 62.75 in | Wt 185.5 lb

## 2014-02-05 DIAGNOSIS — L089 Local infection of the skin and subcutaneous tissue, unspecified: Secondary | ICD-10-CM

## 2014-02-05 DIAGNOSIS — E11628 Type 2 diabetes mellitus with other skin complications: Secondary | ICD-10-CM

## 2014-02-05 DIAGNOSIS — M7989 Other specified soft tissue disorders: Secondary | ICD-10-CM | POA: Insufficient documentation

## 2014-02-05 DIAGNOSIS — M79609 Pain in unspecified limb: Secondary | ICD-10-CM | POA: Insufficient documentation

## 2014-02-05 DIAGNOSIS — E1169 Type 2 diabetes mellitus with other specified complication: Secondary | ICD-10-CM

## 2014-02-05 MED ORDER — CIPROFLOXACIN HCL 500 MG PO TABS: 500.0000 mg | ORAL_TABLET | Freq: Two times a day (BID) | ORAL | Status: DC

## 2014-02-05 MED ORDER — SULFAMETHOXAZOLE-TMP DS 800-160 MG PO TABS: 1.0000 | ORAL_TABLET | Freq: Two times a day (BID) | ORAL | Status: DC

## 2014-02-05 NOTE — Patient Instructions (Signed)
Please take Bactrim as directed.  Continue wound care as directed by your Surgeon.  I will call you with your x-ray results.  If symptoms do not improve or worsen in any way, please go to the ER.    Dressing Change A dressing is a material placed over wounds. It keeps the wound clean, dry, and protected from further injury. This provides an environment that favors wound healing.  BEFORE YOU BEGIN  Get your supplies together. Things you may need include:  Saline solution.  Flexible gauze dressing.  Medicated cream.  Tape.  Gloves.  Abdominal dressing pads.  Gauze squares.  Plastic bags.  Take pain medicine 30 minutes before the dressing change if you need it.  Take a shower before you do the first dressing change of the day. Use plastic wrap or a plastic bag to prevent the dressing from getting wet. REMOVING YOUR OLD DRESSING   Wash your hands with soap and water. Dry your hands with a clean towel.  Put on your gloves.  Remove any tape.  Carefully remove the old dressing. If the dressing sticks, you may dampen it with warm water to loosen it, or follow your caregiver's specific directions.  Remove any gauze or packing tape that is in your wound.  Take off your gloves.  Put the gloves, tape, gauze, or any packing tape into a plastic bag. CHANGING YOUR DRESSING  Open the supplies.  Take the cap off the saline solution.  Open the gauze package so that the gauze remains on the inside of the package.  Put on your gloves.  Clean your wound as told by your caregiver.  If you have been told to keep your wound dry, follow those instructions.  Your caregiver may tell you to do one or more of the following:  Pick up the gauze. Pour the saline solution over the gauze. Squeeze out the extra saline solution.  Put medicated cream or other medicine on your wound if you have been told to do so.  Put the solution soaked gauze only in your wound, not on the skin around  it.  Pack your wound loosely or as told by your caregiver.  Put dry gauze on your wound.  Put abdominal dressing pads over the dry gauze if your wet gauze soaks through.  Tape the abdominal dressing pads in place so they will not fall off. Do not wrap the tape completely around the affected part (arm, leg, abdomen).  Wrap the dressing pads with a flexible gauze dressing to secure it in place.  Take off your gloves. Put them in the plastic bag with the old dressing. Tie the bag shut and throw it away.  Keep the dressing clean and dry until your next dressing change.  Wash your hands. SEEK MEDICAL CARE IF:  Your skin around the wound looks red.  Your wound feels more tender or sore.  You see pus in the wound.  Your wound smells bad.  You have a fever.  Your skin around the wound has a rash that itches and burns.  You see black or yellow skin in your wound that was not there before.  You feel nauseous, throw up, and feel very tired. Document Released: 10/27/2004 Document Revised: 12/12/2011 Document Reviewed: 08/01/2011 [facility] Patient Information 2014 [place].

## 2014-02-05 NOTE — Progress Notes (Signed)
Patient presents to clinic today c/o one day of redness and swelling of her second and fourth phalanges of right foot. Patient recently underwent amputation of her third phalanx. Had extensive course of IV antibiotics, including via PICC line.  Patient also just finished course of amoxicillin given to her by her surgeon. Patient denies fever, chills, aches, malaise and fatigue. Patient is a diabetic. Patient has been changing her dressing daily. Endorses some purulent drainage from wound site. Patient is concerned, because she does not want to have to go into the hospital again or her lose her foot.  Past Medical History  Diagnosis Date  . Hyperlipidemia   . Thyroid disease   . Ulcer   . Diabetes mellitus without complication 10 years    type 2   . Chicken pox as child  . Polycystic ovarian syndrome 99  . DDD (degenerative disc disease)     low back pain  . Diabetes 10/13/2013  . PVD (peripheral vascular disease) 01/01/2014    Right leg [facility] of 2015  . 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 aspirin 81 MG tablet Take 81 mg by mouth daily.      . clopidogrel (PLAVIX) 75 MG tablet Take 75 mg by mouth once.      Marland Kitchen. dexlansoprazole (DEXILANT) 60 MG capsule Take 1 capsule (60 mg total) by mouth daily. Pt has tried and failed Nexium, Zantac, Prilosec  30 capsule  5  . ergocalciferol (VITAMIN D2) 50000 UNITS capsule Take 50,000 Units by mouth once a week.      . fenofibrate 160 MG tablet Take 160 mg by mouth daily.      . fluconazole (DIFLUCAN) 150 MG tablet Take 1 tablet (150 mg total) by mouth once. 1 tab weekly X 2 weeks  2 tablet  1  . HYDROcodone-acetaminophen (NORCO) 10-325 MG per tablet Take 1 tablet by mouth 2 (two) times daily as needed for moderate pain (foot, knee, back pain).  60 tablet  0  . Insulin Aspart Prot & Aspart (NOVOLOG MIX 70/30 FLEXPEN)  (70-30) 100 UNIT/ML Pen Inject 60 Units into the skin daily.  15 mL  3  . LEVEMIR FLEXTOUCH 100 UNIT/ML Pen       . levothyroxine (SYNTHROID, LEVOTHROID) 200 MCG tablet Take 200 mcg by mouth daily before breakfast.      . Magnesium Oxide -Mg Supplement 250 MG TABS Take 1 tablet by mouth daily.      . metformin (FORTAMET) 1000 MG (OSM) 24 hr tablet Take 1 tablet (1,000 mg total) by mouth 2 (two) times daily with a meal.  60 tablet  3  . metoCLOPramide (REGLAN) 5 MG tablet Take 5 mg by mouth daily.      . Omega-3 Fatty Acids (FISH OIL) 1000 MG CAPS Take 2 capsules by mouth daily.      . saxagliptin HCl (ONGLYZA) 5 MG TABS tablet Take 5 mg by mouth daily.       No current facility-administered medications on file prior to visit.    No Known Allergies  Family History  Problem Relation Age of Onset  . COPD Mother 10055  . Diabetes Mother     type 2  . Alcohol abuse Mother   . Thyroid disease Daughter   . Cancer Maternal Grandmother     unsure  . Alcohol abuse  Maternal Grandmother   . Alcohol abuse Father   . Alcohol abuse Brother   . Alcohol abuse Maternal Grandfather   . Alcohol abuse Brother   . Alcohol abuse Brother     History   Social History  . Marital Status: Divorced    Spouse Name: N/A    Number of Children: N/A  . Years of Education: N/A   Social History Main Topics  . Smoking status: Former Smoker -- 0.50 packs/day for 34 years    Types: Cigarettes    Quit date: 12/18/2013  . Smokeless tobacco: Never Used  . Alcohol Use: No  . Drug Use: No  . Sexual Activity: Yes    Partners: Male   Other Topics Concern  . None   Social History Narrative  . None   Review of Systems - See HPI.  All other ROS are negative.  BP 108/76  Pulse 85  Temp(Src) 97.7 F (36.5 C) (Oral)  Resp 16  Ht 5' 2.75" (1.594 m)  Wt 185 lb 8 oz (84.142 kg)  BMI 33.12 kg/m2  SpO2 99%  LMP 05/28/1998  Physical Exam  Vitals reviewed. Constitutional: She is oriented to person, place,  and time and well-developed, well-nourished, and in no distress.  HENT:  Head: Normocephalic and atraumatic.  Cardiovascular: Normal rate, regular rhythm, normal heart sounds and intact distal pulses.   Pulmonary/Chest: Effort normal and breath sounds normal. No respiratory distress. She has no wheezes. She has no rales. She exhibits no tenderness.  Musculoskeletal:  Third phalanx of R foot surgically absent with wound site packed with sterile packing.  Surrounding digits are erythematous without warmth.  Purulent drainage noted from wound.  No evidence of streaking or vascular compromise.  Neurological: She is alert and oriented to person, place, and time.  Skin: Skin is warm and dry. No rash noted.   Assessment/Plan: Diabetic foot infection Rx Bactrim. Continue wound care. Will obtain x-ray to assess for osteomyelitis. Patient is scheduled for followup with her surgeon. Patient to take antibiotic as prescribed. Patient advised to proceed directly to the ER if symptoms worsen while on antibiotic. We'll also send her to ER for IV antibiotic if x-ray shows signs of osteomyelitis.

## 2014-02-06 DIAGNOSIS — E11628 Type 2 diabetes mellitus with other skin complications: Secondary | ICD-10-CM | POA: Insufficient documentation

## 2014-02-06 DIAGNOSIS — L089 Local infection of the skin and subcutaneous tissue, unspecified: Principal | ICD-10-CM

## 2014-02-06 NOTE — Assessment & Plan Note (Signed)
Rx Bactrim. Continue wound care. Will obtain x-ray to assess for osteomyelitis. Patient is scheduled for followup with her surgeon. Patient to take antibiotic as prescribed. Patient advised to proceed directly to the ER if symptoms worsen while on antibiotic. We'll also send her to ER for IV antibiotic if x-ray shows signs of osteomyelitis.

## 2014-02-11 ENCOUNTER — Ambulatory Visit: Payer: Managed Care, Other (non HMO) | Admitting: Family Medicine

## 2014-02-11 ENCOUNTER — Ambulatory Visit (INDEPENDENT_AMBULATORY_CARE_PROVIDER_SITE_OTHER): Payer: 59 | Admitting: Family Medicine

## 2014-02-11 ENCOUNTER — Encounter: Payer: Self-pay | Admitting: Family Medicine

## 2014-02-11 VITALS — BP 110/72 | HR 96 | Temp 98.9°F | Ht 62.75 in | Wt 186.0 lb

## 2014-02-11 DIAGNOSIS — F329 Major depressive disorder, single episode, unspecified: Secondary | ICD-10-CM

## 2014-02-11 DIAGNOSIS — E1169 Type 2 diabetes mellitus with other specified complication: Secondary | ICD-10-CM

## 2014-02-11 DIAGNOSIS — F32A Depression, unspecified: Secondary | ICD-10-CM

## 2014-02-11 DIAGNOSIS — E11628 Type 2 diabetes mellitus with other skin complications: Secondary | ICD-10-CM

## 2014-02-11 DIAGNOSIS — E785 Hyperlipidemia, unspecified: Secondary | ICD-10-CM

## 2014-02-11 DIAGNOSIS — F3289 Other specified depressive episodes: Secondary | ICD-10-CM

## 2014-02-11 DIAGNOSIS — L089 Local infection of the skin and subcutaneous tissue, unspecified: Secondary | ICD-10-CM

## 2014-02-11 DIAGNOSIS — E119 Type 2 diabetes mellitus without complications: Secondary | ICD-10-CM

## 2014-02-11 MED ORDER — ESCITALOPRAM OXALATE 10 MG PO TABS: ORAL_TABLET | ORAL | Status: DC

## 2014-02-11 NOTE — Progress Notes (Signed)
Pre visit review using our clinic review tool, if applicable. No additional management support is needed unless otherwise documented below in the visit note. 

## 2014-02-11 NOTE — Patient Instructions (Signed)
Needs labs drawn at [facility], orders still in computer from January please help her get to that appt    Depression, Adult Depression refers to feeling sad, low, down in the dumps, blue, gloomy, or empty. In general, there are two kinds of depression: 1. Depression that we all experience from time to time because of upsetting life experiences, including the loss of a job or the ending of a relationship (normal sadness or normal grief). This kind of depression is considered normal, is short lived, and resolves within a few days to 2 weeks. (Depression experienced after the loss of a loved one is called bereavement. Bereavement often lasts longer than 2 weeks but normally gets better with time.) 2. Clinical depression, which lasts longer than normal sadness or normal grief or interferes with your ability to function at home, at work, and in school. It also interferes with your personal relationships. It affects almost every aspect of your life. Clinical depression is an illness. Symptoms of depression also can be caused by conditions other than normal sadness and grief or clinical depression. Examples of these conditions are listed as follows:  Physical illness Some physical illnesses, including underactive thyroid gland (hypothyroidism), severe anemia, specific types of cancer, diabetes, uncontrolled seizures, heart and lung problems, strokes, and chronic pain are commonly associated with symptoms of depression.  Side effects of some prescription medicine In some people, certain types of prescription medicine can cause symptoms of depression.  Substance abuse Abuse of alcohol and illicit drugs can cause symptoms of depression. SYMPTOMS Symptoms of normal sadness and normal grief include the following:  Feeling sad or crying for short periods of time.  Not caring about anything (apathy).  Difficulty sleeping or sleeping too much.  No longer able to enjoy the things you used to enjoy.  Desire to be  by oneself all the time (social isolation).  Lack of energy or motivation.  Difficulty concentrating or remembering.  Change in appetite or weight.  Restlessness or agitation. Symptoms of clinical depression include the same symptoms of normal sadness or normal grief and also the following symptoms:  Feeling sad or crying all the time.  Feelings of guilt or worthlessness.  Feelings of hopelessness or helplessness.  Thoughts of suicide or the desire to harm yourself (suicidal ideation).  Loss of touch with reality (psychotic symptoms). Seeing or hearing things that are not real (hallucinations) or having false beliefs about your life or the people around you (delusions and paranoia). DIAGNOSIS  The diagnosis of clinical depression usually is based on the severity and duration of the symptoms. Your caregiver also will ask you questions about your medical history and substance use to find out if physical illness, use of prescription medicine, or substance abuse is causing your depression. Your caregiver also may order blood tests. TREATMENT  Typically, normal sadness and normal grief do not require treatment. However, sometimes antidepressant medicine is prescribed for bereavement to ease the depressive symptoms until they resolve. The treatment for clinical depression depends on the severity of your symptoms but typically includes antidepressant medicine, counseling with a mental health professional, or a combination of both. Your caregiver will help to determine what treatment is best for you. Depression caused by physical illness usually goes away with appropriate medical treatment of the illness. If prescription medicine is causing depression, talk with your caregiver about stopping the medicine, decreasing the dose, or substituting another medicine. Depression caused by abuse of alcohol or illicit drugs abuse goes away with abstinence from these  substances. Some adults need professional  help in order to stop drinking or using drugs. SEEK IMMEDIATE CARE IF:  You have thoughts about hurting yourself or others.  You lose touch with reality (have psychotic symptoms).  You are taking medicine for depression and have a serious side effect. FOR MORE INFORMATION National Alliance on Mental Illness: www.nami.[employer] National Institute of Mental Health: http://www.maynard.net/www.nimh.nih.gov Document Released: 09/16/2000 Document Revised: 03/20/2012 Document Reviewed: 12/19/2011 [facility] Patient Information 2014 [place], [place].

## 2014-02-13 ENCOUNTER — Telehealth: Payer: Self-pay | Admitting: Family Medicine

## 2014-02-13 ENCOUNTER — Ambulatory Visit: Payer: Managed Care, Other (non HMO) | Admitting: Family Medicine

## 2014-02-13 NOTE — Telephone Encounter (Signed)
Received medical records from [facility] °

## 2014-02-16 ENCOUNTER — Encounter: Payer: Self-pay | Admitting: Family Medicine

## 2014-02-16 DIAGNOSIS — F32A Depression, unspecified: Secondary | ICD-10-CM | POA: Insufficient documentation

## 2014-02-16 DIAGNOSIS — F329 Major depressive disorder, single episode, unspecified: Secondary | ICD-10-CM | POA: Insufficient documentation

## 2014-02-16 NOTE — Progress Notes (Signed)
Patient ID: Dawn Cabrera, female   DOB: 09-19-1966, 48 y.o.   MRN: [id number] Dawn Cabrera [id number] 09-19-1966 02/16/2014      Progress Note-Follow Up  Subjective  Chief Complaint  Chief Complaint  Patient presents with  . Follow-up    6 week    HPI  Patient is a 48 year old female in today for routine medical care. She continues to struggle with her diabetic foot ulcer. His 92 IV antibiotics and following closely with infectious disease, surgery and vascular. Was struggling with fevers last week but on the 2 antibiotics and appears to have resolved. No new complaints other than frustration and depression secondary to her illness. No suicidal ideation   Past Medical History  Diagnosis Date  . Hyperlipidemia   . Thyroid disease   . Ulcer   . Diabetes mellitus without complication 10 years    type 2   . Chicken pox as child  . Polycystic ovarian syndrome 99  . DDD (degenerative disc disease)     low back pain  . Diabetes 10/13/2013  . PVD (peripheral vascular disease) 01/01/2014    Right leg [facility]  . 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 Abdominal hysterectomy  05-28-98    total  . Cesarean section  10-10-85  . Abdominal exploration surgery  1999    before hysterectomy  . Toe amputation Right 12/18/13    Family History  Problem Relation Age of Onset  . COPD Mother 4455  . Diabetes Mother     type 2  . Alcohol abuse Mother   . Thyroid disease Daughter   . Cancer Maternal Grandmother     unsure  . Alcohol abuse Maternal Grandmother   . Alcohol abuse Father   . Alcohol abuse Brother   . Alcohol abuse Maternal Grandfather   . Alcohol abuse Brother   . Alcohol abuse Brother     History   Social History  . Marital Status: Divorced    Spouse Name: N/A    Number of Children: N/A  . Years of Education: N/A   Occupational History  . Not on file.   Social  History Main Topics  . Smoking status: Former Smoker -- 0.50 packs/day for 34 years    Types: Cigarettes    Quit date: 12/18/2013  . Smokeless tobacco: Never Used  . Alcohol Use: No  . Drug Use: No  . Sexual Activity: Yes    Partners: Male   Other Topics Concern  . Not on file   Social History Narrative  . No narrative on file    Current Outpatient Prescriptions on File Prior to Visit  Medication Sig Dispense Refill  . aspirin 81 MG tablet Take 81 mg by mouth daily.      . clopidogrel (PLAVIX) 75 MG tablet Take 75 mg by mouth once.      Marland Kitchen. dexlansoprazole (DEXILANT) 60 MG capsule Take 1 capsule (60 mg total) by mouth daily. Pt has tried and failed Nexium, Zantac, Prilosec  30 capsule  5  . ergocalciferol (VITAMIN D2) 50000 UNITS capsule Take 50,000 Units by mouth once a week.      . fenofibrate 160 MG tablet Take 160 mg by mouth daily.      . fluconazole (DIFLUCAN) 150 MG tablet Take 1 tablet (150 mg total) by mouth once. 1 tab weekly X 2 weeks  2 tablet  1  . HYDROcodone-acetaminophen (NORCO) 10-325 MG per tablet Take 1 tablet by mouth 2 (two) times daily as needed for moderate pain (foot, knee, back pain).  60 tablet  0  . Insulin Aspart Prot & Aspart (NOVOLOG MIX 70/30 FLEXPEN) (70-30) 100 UNIT/ML Pen Inject 60 Units into the skin daily.  15 mL  3  . LEVEMIR FLEXTOUCH 100 UNIT/ML Pen       . levothyroxine (SYNTHROID, LEVOTHROID) 200 MCG tablet Take 200 mcg by mouth daily before breakfast.      . Magnesium Oxide -Mg Supplement 250 MG TABS Take 1 tablet by mouth daily.      . metformin (FORTAMET) 1000 MG (OSM) 24 hr tablet Take 1 tablet (1,000 mg total) by mouth 2 (two) times daily with a meal.  60 tablet  3  . metoCLOPramide (REGLAN) 5 MG tablet Take 5 mg by mouth daily.      . Omega-3 Fatty Acids (FISH OIL) 1000 MG CAPS Take 2 capsules by mouth daily.      . saxagliptin HCl (ONGLYZA) 5 MG TABS tablet Take 5 mg by mouth daily.       No current facility-administered medications on  file prior to visit.    No Known Allergies  Review of Systems  Review of Systems  Constitutional: Negative for fever and malaise/fatigue.  HENT: Negative for congestion.   Eyes: Negative for discharge.  Respiratory: Negative for shortness of breath.   Cardiovascular: Negative for chest pain, palpitations and leg swelling.  Gastrointestinal: Negative for nausea, abdominal pain and diarrhea.  Genitourinary: Negative for dysuria.  Musculoskeletal: Negative for falls.  Skin: Negative for rash.  Neurological: Negative for loss of consciousness and headaches.  Endo/Heme/Allergies: Negative for polydipsia.  Psychiatric/Behavioral: Negative for depression and suicidal ideas. The patient is not nervous/anxious and does not have insomnia.     Objective  BP 110/72  Pulse 96  Temp(Src) 98.9 F (37.2 C) (Oral)  Ht 5' 2.75" (1.594 m)  Wt 186 lb (84.369 kg)  BMI 33.21 kg/m2  SpO2 97%  LMP 05/28/1998  Physical Exam  Physical Exam  Constitutional: She is oriented to person, place, and time and well-developed, well-nourished, and in no distress. No distress.  HENT:  Head: Normocephalic and atraumatic.  Eyes: Conjunctivae are normal.  Neck: Neck supple. No thyromegaly present.  Cardiovascular: Normal rate, regular rhythm and normal heart sounds.   No murmur heard. Pulmonary/Chest: Effort normal and breath sounds normal. She has no wheezes.  Abdominal: She exhibits no distension and no mass.  Musculoskeletal: She exhibits no edema.  Lymphadenopathy:    She has no cervical adenopathy.  Neurological: She is alert and oriented to person, place, and time.  Skin: Skin is warm and dry. No rash noted. She is not diaphoretic.  Psychiatric: Memory, affect and judgment normal.      Assessment & Plan  Diabetic foot infection Is [facility]. Will have her sign rel of rec form so we cn get more info.  Is seeing Dr Myles Rosenthalruise of Vascular and Dr Charna ElizabethBHasal of  Infectious Disease. Has a PICC line. Reports she is responding to 2 antibiotics  Hyperlipidemia Encouraged heart healthy diet, increase exercise, avoid trans fats, consider a krill oil cap daily  Diabetes hgba1c acceptable, minimize simple carbs. Increase exercise as tolerated. Continue current meds  Depression Started on Lexapro and return as needed or as scheduled

## 2014-02-16 NOTE — Assessment & Plan Note (Signed)
hgba1c acceptable, minimize simple carbs. Increase exercise as tolerated. Continue current meds 

## 2014-02-16 NOTE — Assessment & Plan Note (Signed)
Started on Lexapro and return as needed or as scheduled

## 2014-02-16 NOTE — Assessment & Plan Note (Signed)
[facility]. Will have her sign rel of rec form so we cn get more info.  Is seeing Dr Myles Rosenthalruise of Vascular and Dr Charna ElizabethBHasal of Infectious Disease. Has a PICC line. Reports she is responding to 2 antibiotics

## 2014-02-16 NOTE — Assessment & Plan Note (Signed)
Encouraged heart healthy diet, increase exercise, avoid trans fats, consider a krill oil cap daily 

## 2014-02-18 ENCOUNTER — Telehealth: Payer: Self-pay | Admitting: Family Medicine

## 2014-02-18 NOTE — Telephone Encounter (Signed)
Received medical records from Dr. Silvestre MesiBhusal

## 2014-02-20 ENCOUNTER — Other Ambulatory Visit (INDEPENDENT_AMBULATORY_CARE_PROVIDER_SITE_OTHER): Payer: 59

## 2014-02-20 DIAGNOSIS — E785 Hyperlipidemia, unspecified: Secondary | ICD-10-CM

## 2014-02-20 DIAGNOSIS — E119 Type 2 diabetes mellitus without complications: Secondary | ICD-10-CM

## 2014-02-20 DIAGNOSIS — E079 Disorder of thyroid, unspecified: Secondary | ICD-10-CM

## 2014-02-21 LAB — HEPATIC FUNCTION PANEL
ALT: 14 U/L (ref 0–35)
AST: 19 U/L (ref 0–37)
Albumin: 4 g/dL (ref 3.5–5.2)
Alkaline Phosphatase: 107 U/L (ref 39–117)
BILIRUBIN TOTAL: 0.5 mg/dL (ref 0.2–1.2)
Bilirubin, Direct: 0.1 mg/dL (ref 0.0–0.3)
Total Protein: 8.1 g/dL (ref 6.0–8.3)

## 2014-02-21 LAB — LIPID PANEL
CHOLESTEROL: 241 mg/dL — AB (ref 0–200)
HDL: 30.3 mg/dL — AB (ref >39.00)
LDL Cholesterol: 151 mg/dL — ABNORMAL HIGH (ref 0–99)
Total CHOL/HDL Ratio: 8
Triglycerides: 298 mg/dL — ABNORMAL HIGH (ref 0.0–149.0)
VLDL: 59.6 mg/dL — AB (ref 0.0–40.0)

## 2014-02-21 LAB — RENAL FUNCTION PANEL
ALBUMIN: 4 g/dL (ref 3.5–5.2)
BUN: 17 mg/dL (ref 6–23)
CALCIUM: 10 mg/dL (ref 8.4–10.5)
CO2: 27 mEq/L (ref 19–32)
CREATININE: 0.7 mg/dL (ref 0.4–1.2)
Chloride: 99 mEq/L (ref 96–112)
GFR: 98.96 mL/min (ref >60.00)
Glucose, Bld: 198 mg/dL — ABNORMAL HIGH (ref 70–99)
Phosphorus: 3.3 mg/dL (ref 2.3–4.6)
Potassium: 5.1 mEq/L (ref 3.5–5.1)
Sodium: 137 mEq/L (ref 135–145)

## 2014-02-21 LAB — CBC
HEMATOCRIT: 40.7 % (ref 36.0–46.0)
Hemoglobin: 13.6 g/dL (ref 12.0–15.0)
MCHC: 33.4 g/dL (ref 30.0–36.0)
MCV: 90.2 fl (ref 78.0–100.0)
Platelets: 461 10*3/uL — ABNORMAL HIGH (ref 150.0–400.0)
RBC: 4.51 Mil/uL (ref 3.87–5.11)
RDW: 13.7 % (ref 11.5–15.5)
WBC: 11.2 10*3/uL — ABNORMAL HIGH (ref 4.0–10.5)

## 2014-02-21 LAB — TSH: TSH: 0.75 u[IU]/mL (ref 0.35–4.50)

## 2014-02-21 LAB — HEMOGLOBIN A1C: Hgb A1c MFr Bld: 11.5 % — ABNORMAL HIGH (ref 4.6–6.5)

## 2014-03-11 ENCOUNTER — Other Ambulatory Visit: Payer: Self-pay

## 2014-03-11 DIAGNOSIS — M549 Dorsalgia, unspecified: Secondary | ICD-10-CM

## 2014-03-11 MED ORDER — HYDROCODONE-ACETAMINOPHEN 10-325 MG PO TABS: 1.0000 | ORAL_TABLET | Freq: Two times a day (BID) | ORAL | Status: DC | PRN

## 2014-03-11 NOTE — Telephone Encounter (Signed)
Pt informed that RX would be ready this afternoon.  rx printed for md to sign

## 2014-04-01 ENCOUNTER — Encounter: Payer: Self-pay | Admitting: Family Medicine

## 2014-04-01 ENCOUNTER — Ambulatory Visit (INDEPENDENT_AMBULATORY_CARE_PROVIDER_SITE_OTHER): Payer: 59 | Admitting: Family Medicine

## 2014-04-01 VITALS — BP 112/68 | HR 75 | Temp 98.0°F | Ht 62.75 in | Wt 183.1 lb

## 2014-04-01 DIAGNOSIS — E1159 Type 2 diabetes mellitus with other circulatory complications: Secondary | ICD-10-CM

## 2014-04-01 DIAGNOSIS — F329 Major depressive disorder, single episode, unspecified: Secondary | ICD-10-CM

## 2014-04-01 DIAGNOSIS — E11628 Type 2 diabetes mellitus with other skin complications: Secondary | ICD-10-CM

## 2014-04-01 DIAGNOSIS — F32A Depression, unspecified: Secondary | ICD-10-CM

## 2014-04-01 DIAGNOSIS — K219 Gastro-esophageal reflux disease without esophagitis: Secondary | ICD-10-CM

## 2014-04-01 DIAGNOSIS — E1169 Type 2 diabetes mellitus with other specified complication: Secondary | ICD-10-CM

## 2014-04-01 DIAGNOSIS — F341 Dysthymic disorder: Secondary | ICD-10-CM

## 2014-04-01 DIAGNOSIS — F419 Anxiety disorder, unspecified: Principal | ICD-10-CM

## 2014-04-01 DIAGNOSIS — L089 Local infection of the skin and subcutaneous tissue, unspecified: Secondary | ICD-10-CM

## 2014-04-01 MED ORDER — ESCITALOPRAM OXALATE 20 MG PO TABS: 20.0000 mg | ORAL_TABLET | Freq: Every day | ORAL | Status: DC

## 2014-04-01 MED ORDER — INSULIN DETEMIR 100 UNIT/ML FLEXPEN: 50.0000 [IU] | PEN_INJECTOR | Freq: Every day | SUBCUTANEOUS | Status: DC

## 2014-04-01 NOTE — Progress Notes (Signed)
Pre visit review using our clinic review tool, if applicable. No additional management support is needed unless otherwise documented below in the visit note. 

## 2014-04-01 NOTE — Patient Instructions (Signed)
Call if no improvement in 4 weeks for change to Zoloft  Basic Carbohydrate Counting for Diabetes Mellitus Carbohydrate counting is a method for keeping track of the amount of carbohydrates you eat. Eating carbohydrates naturally increases the level of sugar (glucose) in your blood, so it is important for you to know the amount that is okay for you to have in every meal. Carbohydrate counting helps keep the level of glucose in your blood within normal limits. The amount of carbohydrates allowed is different for every person. A dietitian can help you calculate the amount that is right for you. Once you know the amount of carbohydrates you can have, you can count the carbohydrates in the foods you want to eat. Carbohydrates are found in the following foods:  Grains, such as breads and cereals.  Dried beans and soy products.  Starchy vegetables, such as potatoes, peas, and corn.  Fruit and fruit juices.  Milk and yogurt.  Sweets and snack foods, such as cake, cookies, candy, chips, soft drinks, and fruit drinks. CARBOHYDRATE COUNTING There are two ways to count the carbohydrates in your food. You can use either of the methods or a combination of both. Reading the "Nutrition Facts" on Packaged Food The "Nutrition Facts" is an area that is included on the labels of almost all packaged food and beverages in the [place]. It includes the serving size of that food or beverage and information about the nutrients in each serving of the food, including the grams (g) of carbohydrate per serving.  Decide the number of servings of this food or beverage that you will be able to eat or drink. Multiply that number of servings by the number of grams of carbohydrate that is listed on the label for that serving. The total will be the amount of carbohydrates you will be having when you eat or drink this food or beverage. Learning Standard Serving Sizes of Food When you eat food that is not packaged or does not  include "Nutrition Facts" on the label, you need to measure the servings in order to count the amount of carbohydrates.A serving of most carbohydrate-rich foods contains about 15 g of carbohydrates. The following list includes serving sizes of carbohydrate-rich foods that provide 15 g ofcarbohydrate per serving:   1 slice of bread (1 oz) or 1 six-inch tortilla.    of a hamburger bun or English muffin.  4-6 crackers.   cup unsweetened dry cereal.    cup hot cereal.   cup rice or pasta.    cup mashed potatoes or  of a large baked potato.  1 cup fresh fruit or one small piece of fruit.    cup canned or frozen fruit or fruit juice.  1 cup milk.   cup plain fat-free yogurt or yogurt sweetened with artificial sweeteners.   cup cooked dried beans or starchy vegetable, such as peas, corn, or potatoes.  Decide the number of standard-size servings that you will eat. Multiply that number of servings by 15 (the grams of carbohydrates in that serving). For example, if you eat 2 cups of strawberries, you will have eaten 2 servings and 30 g of carbohydrates (2 servings x 15 g = 30 g). For foods such as soups and casseroles, in which more than one food is mixed in, you will need to count the carbohydrates in each food that is included. EXAMPLE OF CARBOHYDRATE COUNTING Sample Dinner  3 oz chicken breast.   cup of brown rice.   cup  of corn.  1 cup milk.   1 cup strawberries with sugar-free whipped topping.  Carbohydrate Calculation Step 1: Identify the foods that contain carbohydrates:   Rice.   Corn.   Milk.   Strawberries. Step 2:Calculate the number of servings eaten of each:   2 servings of rice.   1 serving of corn.   1 serving of milk.   1 serving of strawberries. Step 3: Multiply each of those number of servings by 15 g:   2 servings of rice x 15 g = 30 g.   1 serving of corn x 15 g = 15 g.   1 serving of milk x 15 g = 15 g.   1  serving of strawberries x 15 g = 15 g. Step 4: Add together all of the amounts to find the total grams of carbohydrates eaten: 30 g + 15 g + 15 g + 15 g = 75 g. Document Released: 09/19/2005 Document Revised: 09/24/2013 Document Reviewed: 08/16/2013 [facility] Patient Information 2015 [place], [place]LL[place]. This information is not intended to replace advice given to you by your health care provider. Make sure you discuss any questions you have with your health care provider.

## 2014-04-01 NOTE — Progress Notes (Signed)
Patient ID: Dawn Cabrera, female   DOB: [birth year], 48 y.o.   MRN: [id number] Pt here for 6 week f/u. Pt reports foot infection is improving and is has appointment in 2 wks at wound center in [place]. Pt completed antibiotics 1 wk ago and sts GI Sxs (diarrhea) have resolved. Pt concerned that new med Lexapro is "not working" after 6 weeks and reports feelings of depression continue. Pt c/o back pain, chronic "not any worse". Denies pain otherwise. Denies fever, chills, n/v/d. sts CBGs at home ranging 110-127 since completing antibiotics.

## 2014-04-05 ENCOUNTER — Encounter: Payer: Self-pay | Admitting: Family Medicine

## 2014-04-05 DIAGNOSIS — F329 Major depressive disorder, single episode, unspecified: Secondary | ICD-10-CM | POA: Insufficient documentation

## 2014-04-05 DIAGNOSIS — F419 Anxiety disorder, unspecified: Principal | ICD-10-CM

## 2014-04-05 DIAGNOSIS — E1165 Type 2 diabetes mellitus with hyperglycemia: Secondary | ICD-10-CM | POA: Insufficient documentation

## 2014-04-05 DIAGNOSIS — IMO0002 Reserved for concepts with insufficient information to code with codable children: Secondary | ICD-10-CM | POA: Insufficient documentation

## 2014-04-05 NOTE — Assessment & Plan Note (Signed)
Improving after treatment, patient notes sugars have been improving as her infection has been improving. Will monitor

## 2014-04-05 NOTE — Progress Notes (Signed)
Patient ID: Dawn Cabrera, female   DOB: 05/15/1966, 48 y.o.   MRN: [id number] Emogene Muratalla [id number] [birth year] 04/05/2014      Progress Note-Follow Up  Subjective  Chief Complaint  Chief Complaint  Patient presents with  . Follow-up    6 week    HPI  Patient is a 48 year old female in today for routine medical care. [place] to [place]. Infection in her foot is slowly improving and she  is struggling with [facility]. No fevers or chills. Pain is improving. Her blood sugars are improving as her infection improves. She reports her blood sugars the last 2 days and 110 to 130. No polyuria or polydipsia he does acknowledge persistent depression and anxiety. No suicidal ideation. Denies CP/palp/SOB/HA/congestion/fevers/GI or GU c/o. Taking meds as prescribed  Past Medical History  Diagnosis Date  . Hyperlipidemia   . Thyroid disease   . Ulcer   . Diabetes mellitus without complication 10 years    type 2   . Chicken pox as child  . Polycystic ovarian syndrome 99  . DDD (degenerative disc disease)     low back pain  . Diabetes 10/13/2013  . PVD (peripheral vascular disease) 01/01/2014    Right leg S/p 3 stents in right leg at [facility] in March of 2015  . 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 Abdominal hysterectomy  05-28-98    total  . Cesarean section  10-10-85  . Abdominal exploration surgery  1999    before hysterectomy  . Toe amputation Right 12/18/13    Family History  Problem Relation Age of Onset  . COPD Mother 38  . Diabetes Mother     type 2  . Alcohol abuse Mother   . Thyroid disease Daughter   . Cancer Maternal Grandmother     unsure  . Alcohol abuse Maternal Grandmother   . Alcohol abuse Father   . Alcohol abuse Brother   . Alcohol abuse Maternal Grandfather   . Alcohol abuse Brother   . Alcohol abuse Brother     History   Social History  .  Marital Status: Divorced    Spouse Name: N/A    Number of Children: N/A  . Years of Education: N/A   Occupational History  . Not on file.   Social History Main Topics  . Smoking status: Former Smoker -- 0.50 packs/day for 34 years    Types: Cigarettes    Quit date: 12/18/2013  . Smokeless tobacco: Never Used  . Alcohol Use: No  . Drug Use: No  . Sexual Activity: Yes    Partners: Male   Other Topics Concern  . Not on file   Social History Narrative  . No narrative on file    Current Outpatient Prescriptions on File Prior to Visit  Medication Sig Dispense Refill  . aspirin 81 MG tablet Take 81 mg by mouth daily.      . clopidogrel (PLAVIX) 75 MG tablet Take 75 mg by mouth once.      . ergocalciferol (VITAMIN D2) 50000 UNITS capsule Take 50,000 Units by mouth once a week.      Marland Kitchen HYDROcodone-acetaminophen (NORCO) 10-325 MG per tablet Take 1 tablet by mouth 2 (two) times daily as needed for moderate pain (foot, knee, back pain).  60 tablet  0  . LEVEMIR FLEXTOUCH 100 UNIT/ML Pen       .  levothyroxine (SYNTHROID, LEVOTHROID) 200 MCG tablet Take 200 mcg by mouth daily before breakfast.      . Magnesium Oxide -Mg Supplement 250 MG TABS Take 1 tablet by mouth daily.      . metformin (FORTAMET) 1000 MG (OSM) 24 hr tablet Take 1 tablet (1,000 mg total) by mouth 2 (two) times daily with a meal.  60 tablet  3  . Omega-3 Fatty Acids (FISH OIL) 1000 MG CAPS Take 2 capsules by mouth daily.      . saxagliptin HCl (ONGLYZA) 5 MG TABS tablet Take 5 mg by mouth daily.       No current facility-administered medications on file prior to visit.    No Known Allergies  Review of Systems  Review of Systems  Constitutional: Negative for fever and malaise/fatigue.  HENT: Negative for congestion.   Eyes: Negative for pain and discharge.  Respiratory: Negative for shortness of breath.   Cardiovascular: Negative for chest pain, palpitations and leg swelling.  Gastrointestinal: Negative for  nausea, abdominal pain and diarrhea.  Genitourinary: Negative.  Negative for dysuria.  Musculoskeletal: Positive for joint pain. Negative for falls.  Skin: Negative for rash.  Neurological: Negative for loss of consciousness and headaches.  Endo/Heme/Allergies: Negative for polydipsia.  Psychiatric/Behavioral: Positive for depression. Negative for suicidal ideas. The patient is nervous/anxious. The patient does not have insomnia.     Objective  BP 112/68  Pulse 75  Temp(Src) 98 F (36.7 C) (Oral)  Ht 5' 2.75" (1.594 m)  Wt 183 lb 1.3 oz (83.045 kg)  BMI 32.68 kg/m2  SpO2 97%  LMP 05/28/1998  Physical Exam  Physical Exam  Constitutional: She is oriented to person, place, and time and well-developed, well-nourished, and in no distress. No distress.  HENT:  Head: Normocephalic and atraumatic.  Eyes: Conjunctivae are normal.  Neck: Neck supple. No thyromegaly present.  Cardiovascular: Normal rate, regular rhythm and normal heart sounds.   No murmur heard. Pulmonary/Chest: Effort normal and breath sounds normal. She has no wheezes.  Abdominal: She exhibits no distension and no mass.  Musculoskeletal: She exhibits no edema.  Lymphadenopathy:    She has no cervical adenopathy.  Neurological: She is alert and oriented to person, place, and time.  Skin: Skin is warm and dry. No rash noted. She is not diaphoretic.  Psychiatric: Memory, affect and judgment normal.    Lab Results  Component Value Date   TSH 0.75 02/20/2014   Lab Results  Component Value Date   WBC 11.2* 02/20/2014   HGB 13.6 02/20/2014   HCT 40.7 02/20/2014   MCV 90.2 02/20/2014   PLT 461.0* 02/20/2014   Lab Results  Component Value Date   CREATININE 0.7 02/20/2014   BUN 17 02/20/2014   NA 137 02/20/2014   K 5.1 02/20/2014   CL 99 02/20/2014   CO2 27 02/20/2014   Lab Results  Component Value Date   ALT 14 02/20/2014   AST 19 02/20/2014   ALKPHOS 107 02/20/2014   BILITOT 0.5 02/20/2014   Lab Results   Component Value Date   CHOL 241* 02/20/2014   Lab Results  Component Value Date   HDL 30.30* 02/20/2014   Lab Results  Component Value Date   LDLCALC 151* 02/20/2014   Lab Results  Component Value Date   TRIG 298.0* 02/20/2014   Lab Results  Component Value Date   CHOLHDL 8 02/20/2014     Assessment & Plan  Gastro-esophageal reflux Avoid offending foods, start probiotics. Do not eat  large meals in late evening and consider raising head of bed.   Type II or unspecified type diabetes mellitus with peripheral circulatory disorders, uncontrolled(250.72) Encouraged to minimize simple carbs. Continue Levemir and Metfromin. Offered referral to endocrinology vs returning to discuss options due to hi hgba1c after visit is obtained  Diabetic foot infection Improving after treatment, patient notes sugars have been improving as her infection has been improving. Will monitor  Anxiety and depression Will try Lexapro 20 mg daily.

## 2014-04-05 NOTE — Assessment & Plan Note (Signed)
Avoid offending foods, start probiotics. Do not eat large meals in late evening and consider raising head of bed.  

## 2014-04-05 NOTE — Assessment & Plan Note (Signed)
Will try Lexapro 20 mg daily.

## 2014-04-05 NOTE — Assessment & Plan Note (Signed)
Encouraged to minimize simple carbs. Continue Levemir and Metfromin. Offered referral to endocrinology vs returning to discuss options due to hi hgba1c after visit is obtained

## 2014-04-15 ENCOUNTER — Telehealth: Payer: Self-pay | Admitting: *Deleted

## 2014-04-15 MED ORDER — SAXAGLIPTIN HCL 5 MG PO TABS: 5.0000 mg | ORAL_TABLET | Freq: Every day | ORAL | Status: DC

## 2014-04-15 MED ORDER — LEVOTHYROXINE SODIUM 200 MCG PO TABS: 200.0000 ug | ORAL_TABLET | Freq: Every day | ORAL | Status: DC

## 2014-04-15 MED ORDER — HYDROCODONE-ACETAMINOPHEN 10-325 MG PO TABS: 1.0000 | ORAL_TABLET | Freq: Two times a day (BID) | ORAL | Status: DC | PRN

## 2014-04-15 NOTE — Telephone Encounter (Signed)
Rx sent to front desk for pick up and pt has been notified. 

## 2014-04-15 NOTE — Telephone Encounter (Signed)
Pt left message on 04/14/14 requesting refill of hydrocodone, levothyroxine and onglyza. Last hydrocodone Rx 03/11/14, #60. Rx printed and forwarded to PRovider for signature. Refills sent for levothyroxine and onglyza.

## 2014-05-12 ENCOUNTER — Telehealth: Payer: Self-pay

## 2014-05-12 NOTE — Telephone Encounter (Signed)
Pt stated "I was taking an antidepressant and was told if it was not working to call in and they would switch to Zoloft. Let the physician know it is not working and switch."

## 2014-05-12 NOTE — Telephone Encounter (Signed)
OK to d/c Lexapro and start Sertraline 50 mg daily, can take 1/2 of a Lexapro and and 1/2 of the Sertraline for one week then stop Lexapro and start a whole Sertraline 50 mg daily. Disp #30 with 2 rf and make sure she has a follow up appt in next couple of months

## 2014-05-13 MED ORDER — SERTRALINE HCL 50 MG PO TABS: 50.0000 mg | ORAL_TABLET | Freq: Every day | ORAL | Status: DC

## 2014-05-13 NOTE — Telephone Encounter (Signed)
Left a detailed message on vm and sent RX

## 2014-05-30 ENCOUNTER — Telehealth: Payer: Self-pay

## 2014-05-30 MED ORDER — HYDROCODONE-ACETAMINOPHEN 10-325 MG PO TABS: 1.0000 | ORAL_TABLET | Freq: Two times a day (BID) | ORAL | Status: DC | PRN

## 2014-05-30 NOTE — Telephone Encounter (Signed)
Rx printed. Waiting on signature. LDM 

## 2014-06-10 ENCOUNTER — Ambulatory Visit (INDEPENDENT_AMBULATORY_CARE_PROVIDER_SITE_OTHER): Payer: 59 | Admitting: Family Medicine

## 2014-06-10 ENCOUNTER — Encounter: Payer: Self-pay | Admitting: Family Medicine

## 2014-06-10 VITALS — BP 149/87 | HR 94 | Temp 98.9°F | Ht 62.75 in | Wt 185.2 lb

## 2014-06-10 DIAGNOSIS — E079 Disorder of thyroid, unspecified: Secondary | ICD-10-CM

## 2014-06-10 DIAGNOSIS — R030 Elevated blood-pressure reading, without diagnosis of hypertension: Secondary | ICD-10-CM

## 2014-06-10 DIAGNOSIS — I739 Peripheral vascular disease, unspecified: Secondary | ICD-10-CM

## 2014-06-10 DIAGNOSIS — E1159 Type 2 diabetes mellitus with other circulatory complications: Secondary | ICD-10-CM

## 2014-06-10 DIAGNOSIS — E785 Hyperlipidemia, unspecified: Secondary | ICD-10-CM

## 2014-06-10 DIAGNOSIS — IMO0001 Reserved for inherently not codable concepts without codable children: Secondary | ICD-10-CM

## 2014-06-10 NOTE — Progress Notes (Signed)
Pre visit review using our clinic review tool, if applicable. No additional management support is needed unless otherwise documented below in the visit note. 

## 2014-06-10 NOTE — Patient Instructions (Signed)

## 2014-06-10 NOTE — Progress Notes (Signed)
Patient ID: Dawn Cabrera, female   DOB: 1965/11/13, 48 y.o.   MRN: [id number] Dawn Cabrera [id number] 1966/03/11 06/10/2014      Progress Note-Follow Up  Subjective  Chief Complaint  Chief Complaint  Patient presents with  . Follow-up    10 week    HPI  Patient is a 48 year old female in today for routine medical care. She is in today for followup. Her blood sugars are improving. Improving. fasting sugar today was 152, hi of 270 and low of 102 since last visit. Her foot ulcer has almost resolved with care from podiatry. No recent illness. Denies CP/palp/SOB/HA/congestion/fevers/GI or GU c/o. Taking meds as prescribed  Past Medical History  Diagnosis Date  . Hyperlipidemia   . Thyroid disease   . Ulcer   . Diabetes mellitus without complication 10 years    type 2   . Chicken pox as child  . Polycystic ovarian syndrome 99  . DDD (degenerative disc disease)     low back pain  . Diabetes 10/13/2013  . PVD (peripheral vascular disease) 01/01/2014    Right leg S/p 3 stents in right leg at [facility] in March of 2015  . 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 Abdominal hysterectomy  05-28-98    total  . Cesarean section  10-10-85  . Abdominal exploration surgery  1999    before hysterectomy  . Toe amputation Right 12/18/13    Family History  Problem Relation Age of Onset  . COPD Mother 35  . Diabetes Mother     type 2  . Alcohol abuse Mother   . Thyroid disease Daughter   . Cancer Maternal Grandmother     unsure  . Alcohol abuse Maternal Grandmother   . Alcohol abuse Father   . Alcohol abuse Brother   . Alcohol abuse Maternal Grandfather   . Alcohol abuse Brother   . Alcohol abuse Brother     History   Social History  . Marital Status: Divorced    Spouse Name: N/A    Number of Children: N/A  . Years of Education: N/A   Occupational History  . Not on file.   Social History Main Topics  . Smoking  status: Former Smoker -- 0.50 packs/day for 34 years    Types: Cigarettes    Quit date: 12/18/2013  . Smokeless tobacco: Never Used  . Alcohol Use: No  . Drug Use: No  . Sexual Activity: Yes    Partners: Male   Other Topics Concern  . Not on file   Social History Narrative  . No narrative on file    Current Outpatient Prescriptions on File Prior to Visit  Medication Sig Dispense Refill  . aspirin 81 MG tablet Take 81 mg by mouth daily.      . clopidogrel (PLAVIX) 75 MG tablet Take 75 mg by mouth once.      . ergocalciferol (VITAMIN D2) 50000 UNITS capsule Take 50,000 Units by mouth once a week.      Marland Kitchen HYDROcodone-acetaminophen (NORCO) 10-325 MG per tablet Take 1 tablet by mouth 2 (two) times daily as needed for moderate pain (foot, knee, back pain).  60 tablet  0  . Insulin Detemir (LEVEMIR FLEXPEN) 100 UNIT/ML Pen Inject 50 Units into the skin daily at 10 pm.  15 mL  5  . LEVEMIR FLEXTOUCH 100 UNIT/ML Pen       . levothyroxine (SYNTHROID, LEVOTHROID)  200 MCG tablet Take 1 tablet (200 mcg total) by mouth daily before breakfast.  30 tablet  3  . Magnesium Oxide -Mg Supplement 250 MG TABS Take 1 tablet by mouth daily.      . metformin (FORTAMET) 1000 MG (OSM) 24 hr tablet Take 1 tablet (1,000 mg total) by mouth 2 (two) times daily with a meal.  60 tablet  3  . Omega-3 Fatty Acids (FISH OIL) 1000 MG CAPS Take 2 capsules by mouth daily.      . saxagliptin HCl (ONGLYZA) 5 MG TABS tablet Take 1 tablet (5 mg total) by mouth daily.  30 tablet  3  . sertraline (ZOLOFT) 50 MG tablet Take 1 tablet (50 mg total) by mouth daily.  30 tablet  2   No current facility-administered medications on file prior to visit.    No Known Allergies  Review of Systems  Review of Systems  Constitutional: Negative for fever and malaise/fatigue.  HENT: Negative for congestion.   Eyes: Negative for discharge.  Respiratory: Negative for shortness of breath.   Cardiovascular: Negative for chest pain,  palpitations and leg swelling.  Gastrointestinal: Negative for nausea, abdominal pain and diarrhea.  Genitourinary: Negative for dysuria.  Musculoskeletal: Negative for falls.  Skin: Negative for rash.  Neurological: Negative for loss of consciousness and headaches.  Endo/Heme/Allergies: Negative for polydipsia.  Psychiatric/Behavioral: Negative for depression and suicidal ideas. The patient is not nervous/anxious and does not have insomnia.     Objective  BP 149/87  Pulse 94  Temp(Src) 98.9 F (37.2 C) (Oral)  Ht 5' 2.75" (1.594 m)  Wt 185 lb 3.2 oz (84.006 kg)  BMI 33.06 kg/m2  SpO2 98%  LMP 05/28/1998  Physical Exam  Physical Exam  Constitutional: She is oriented to person, place, and time and well-developed, well-nourished, and in no distress. No distress.  HENT:  Head: Normocephalic and atraumatic.  Eyes: Conjunctivae are normal.  Neck: Neck supple. No thyromegaly present.  Cardiovascular: Normal rate, regular rhythm and normal heart sounds.   No murmur heard. Pulmonary/Chest: Effort normal and breath sounds normal. She has no wheezes.  Abdominal: She exhibits no distension and no mass.  Musculoskeletal: She exhibits no edema.  Right foot, 3rd toe amputated.healing well, no surrounding erythema, small opening at base where toe was  Lymphadenopathy:    She has no cervical adenopathy.  Neurological: She is alert and oriented to person, place, and time.  Skin: Skin is warm and dry. No rash noted. She is not diaphoretic.  Psychiatric: Memory, affect and judgment normal.    Lab Results  Component Value Date   TSH 0.75 02/20/2014   Lab Results  Component Value Date   WBC 11.2* 02/20/2014   HGB 13.6 02/20/2014   HCT 40.7 02/20/2014   MCV 90.2 02/20/2014   PLT 461.0* 02/20/2014   Lab Results  Component Value Date   CREATININE 0.7 02/20/2014   BUN 17 02/20/2014   NA 137 02/20/2014   K 5.1 02/20/2014   CL 99 02/20/2014   CO2 27 02/20/2014   Lab Results  Component Value  Date   ALT 14 02/20/2014   AST 19 02/20/2014   ALKPHOS 107 02/20/2014   BILITOT 0.5 02/20/2014   Lab Results  Component Value Date   CHOL 241* 02/20/2014   Lab Results  Component Value Date   HDL 30.30* 02/20/2014   Lab Results  Component Value Date   LDLCALC 151* 02/20/2014   Lab Results  Component Value Date  TRIG 298.0* 02/20/2014   Lab Results  Component Value Date   CHOLHDL 8 02/20/2014     Assessment & Plan   Ulcer Foot ulcer resolving with care of podiatry. Pain greatly improved  Type II or unspecified type diabetes mellitus with peripheral circulatory disorders, uncontrolled(250.72) Improving. fasting sugar today was 152, hi of 270 and low of 102 since last visit. hgba1c acceptable, minimize simple carbs. Increase exercise as tolerated. A1C improving, increase Levemir to 54 and as tolerated  Thyroid disease On Levothyroxine, continue to monitor  Hyperlipidemia Needs Atorvastatin, encouraged heart healthy diet, avoid trans fats, minimize simple carbs and saturated fats. Increase exercise as tolerated.

## 2014-06-11 LAB — RENAL FUNCTION PANEL
ALBUMIN: 3.6 g/dL (ref 3.5–5.2)
BUN: 24 mg/dL — AB (ref 6–23)
CALCIUM: 9.7 mg/dL (ref 8.4–10.5)
CO2: 28 mEq/L (ref 19–32)
CREATININE: 0.9 mg/dL (ref 0.4–1.2)
Chloride: 101 mEq/L (ref 96–112)
GFR: 68.55 mL/min (ref >60.00)
GLUCOSE: 295 mg/dL — AB (ref 70–99)
PHOSPHORUS: 3.5 mg/dL (ref 2.3–4.6)
Potassium: 5.1 mEq/L (ref 3.5–5.1)
Sodium: 138 mEq/L (ref 135–145)

## 2014-06-11 LAB — LIPID PANEL
Cholesterol: 296 mg/dL — ABNORMAL HIGH (ref 0–200)
HDL: 35.6 mg/dL — ABNORMAL LOW (ref >39.00)
NonHDL: 260.4
Total CHOL/HDL Ratio: 8
Triglycerides: 575 mg/dL — ABNORMAL HIGH (ref 0.0–149.0)
VLDL: 115 mg/dL — ABNORMAL HIGH (ref 0.0–40.0)

## 2014-06-11 LAB — HEPATIC FUNCTION PANEL
ALT: 15 U/L (ref 0–35)
AST: 16 U/L (ref 0–37)
Albumin: 3.6 g/dL (ref 3.5–5.2)
Alkaline Phosphatase: 112 U/L (ref 39–117)
BILIRUBIN DIRECT: 0 mg/dL (ref 0.0–0.3)
BILIRUBIN TOTAL: 0.4 mg/dL (ref 0.2–1.2)
TOTAL PROTEIN: 7.2 g/dL (ref 6.0–8.3)

## 2014-06-11 LAB — CBC
HEMATOCRIT: 38 % (ref 36.0–46.0)
Hemoglobin: 12.8 g/dL (ref 12.0–15.0)
MCHC: 33.8 g/dL (ref 30.0–36.0)
MCV: 88.7 fl (ref 78.0–100.0)
Platelets: 324 10*3/uL (ref 150.0–400.0)
RBC: 4.28 Mil/uL (ref 3.87–5.11)
RDW: 13.5 % (ref 11.5–15.5)
WBC: 11 10*3/uL — ABNORMAL HIGH (ref 4.0–10.5)

## 2014-06-11 LAB — HEMOGLOBIN A1C: HEMOGLOBIN A1C: 10.6 % — AB (ref 4.6–6.5)

## 2014-06-11 LAB — TSH: TSH: 3.26 u[IU]/mL (ref 0.35–4.50)

## 2014-06-11 LAB — LDL CHOLESTEROL, DIRECT: LDL DIRECT: 188.8 mg/dL

## 2014-06-13 MED ORDER — ATORVASTATIN CALCIUM 20 MG PO TABS: 20.0000 mg | ORAL_TABLET | Freq: Every day | ORAL | Status: DC

## 2014-06-15 ENCOUNTER — Encounter: Payer: Self-pay | Admitting: Family Medicine

## 2014-06-15 DIAGNOSIS — E782 Mixed hyperlipidemia: Secondary | ICD-10-CM | POA: Insufficient documentation

## 2014-06-15 DIAGNOSIS — E785 Hyperlipidemia, unspecified: Secondary | ICD-10-CM

## 2014-06-15 HISTORY — DX: Hyperlipidemia, unspecified: E78.5

## 2014-06-15 NOTE — Assessment & Plan Note (Signed)
Foot ulcer resolving with care of podiatry. Pain greatly improved

## 2014-06-15 NOTE — Assessment & Plan Note (Addendum)
Improving. fasting sugar today was 152, hi of 270 and low of 102 since last visit. hgba1c acceptable, minimize simple carbs. Increase exercise as tolerated. A1C improving, increase Levemir to 54 and as tolerated

## 2014-06-15 NOTE — Assessment & Plan Note (Signed)
On Levothyroxine, continue to monitor 

## 2014-06-15 NOTE — Assessment & Plan Note (Signed)
Needs Atorvastatin, encouraged heart healthy diet, avoid trans fats, minimize simple carbs and saturated fats. Increase exercise as tolerated.

## 2014-06-27 ENCOUNTER — Telehealth: Payer: Self-pay

## 2014-06-27 NOTE — Telephone Encounter (Signed)
Dawn Cabrera. Self [phone] Rosey Bath called said she went to pharmacy to pick up clopidogrel (PLAVIX) 75 MG tablet and it was not there. She also said that she had started on her new cholesterol med-[place] drug and she is now experiencing severe leg cramps, causing little sleep and trouble walking

## 2014-06-27 NOTE — Telephone Encounter (Signed)
Please advise 

## 2014-06-28 NOTE — Telephone Encounter (Signed)
I am willing to take over Plavix, it looks like we have not prescribed before, find out if she wants #30 with 5 rf or #90 with 1 rf. Stop the statin and if her symptoms get better let us know and we will try something else

## 2014-06-30 MED ORDER — CLOPIDOGREL BISULFATE 75 MG PO TABS: 75.0000 mg | ORAL_TABLET | Freq: Once | ORAL | Status: AC

## 2014-06-30 NOTE — Telephone Encounter (Signed)
Pt informed and voiced understanding  Pt would like a 90 day supply  rx sent

## 2014-07-15 ENCOUNTER — Telehealth: Payer: Self-pay | Admitting: Family Medicine

## 2014-07-15 NOTE — Telephone Encounter (Signed)
Caller name:Annebelle Mccoll Relation to RU:EAVWpt:self Call back [phone] Pharmacy:  Reason for call: pt is needing new rx for HYDROcodone-acetaminophen (NORCO) 10-325 MG per tablet Please call when available for pick up.

## 2014-07-15 NOTE — Telephone Encounter (Signed)
Last OV 06-10-14 Hydrocodone last filled 05-30-14 #60 with 0  Ok for refill

## 2014-07-15 NOTE — Telephone Encounter (Signed)
Will sign refill on Hydrocodone on 10/15

## 2014-07-17 MED ORDER — HYDROCODONE-ACETAMINOPHEN 10-325 MG PO TABS: 1.0000 | ORAL_TABLET | Freq: Two times a day (BID) | ORAL | Status: DC | PRN

## 2014-07-17 NOTE — Telephone Encounter (Signed)
Please inform pt that this rx is ready to be picked up

## 2014-07-17 NOTE — Telephone Encounter (Signed)
Left detailed message informing patient of this. °

## 2014-09-17 ENCOUNTER — Other Ambulatory Visit: Payer: Self-pay | Admitting: Family Medicine

## 2014-09-17 NOTE — Telephone Encounter (Signed)
OK to refill Norco for patient same strength, same sig, disp #60

## 2014-09-17 NOTE — Telephone Encounter (Signed)
Patient requesting refill on Norco 10/325 UDS not on file Contract not on file Last OV 06/10/2014 Last refill 07/17/2014 #60 no refills  Please advise

## 2014-09-17 NOTE — Telephone Encounter (Signed)
Caller name: Shalawn Relation to pt: self Call back number: [phone] Pharmacy:  Reason for call:   Requesting a new norco rx

## 2014-09-18 MED ORDER — HYDROCODONE-ACETAMINOPHEN 10-325 MG PO TABS: 1.0000 | ORAL_TABLET | Freq: Two times a day (BID) | ORAL | Status: DC | PRN

## 2014-09-18 NOTE — Telephone Encounter (Signed)
Romeo Appleatrice Hu [phone]   Paulina called to check on the RX and she would like to pick it up around 1-2 when she has someone that can drive her. She has [place] eye pre op appointment in [place] before this.

## 2014-09-18 NOTE — Telephone Encounter (Signed)
Printed, placed for sig. Advised patient via message that it will be ready to pick up.

## 2014-09-23 ENCOUNTER — Other Ambulatory Visit: Payer: Self-pay | Admitting: Family Medicine

## 2014-09-23 ENCOUNTER — Telehealth: Payer: Self-pay | Admitting: *Deleted

## 2014-09-23 NOTE — Telephone Encounter (Signed)
Received surgical clearance form via fax from [facility] for cataract extraction of right/left eye with Phaco IOL, topical anesthesia. Form, along with most recent OV note/EKG, forwarded to Dr. Abner GreenspanBlyth. JG//CMA

## 2014-09-24 NOTE — Telephone Encounter (Signed)
Last filled: 05/13/14 Amt: 30, 2 refills Last OV: 06/10/14  Med filled.

## 2014-10-13 ENCOUNTER — Other Ambulatory Visit (INDEPENDENT_AMBULATORY_CARE_PROVIDER_SITE_OTHER): Payer: 59

## 2014-10-13 ENCOUNTER — Other Ambulatory Visit: Payer: 59

## 2014-10-13 DIAGNOSIS — E1151 Type 2 diabetes mellitus with diabetic peripheral angiopathy without gangrene: Secondary | ICD-10-CM

## 2014-10-13 DIAGNOSIS — E1159 Type 2 diabetes mellitus with other circulatory complications: Secondary | ICD-10-CM

## 2014-10-13 DIAGNOSIS — I739 Peripheral vascular disease, unspecified: Secondary | ICD-10-CM

## 2014-10-13 DIAGNOSIS — E785 Hyperlipidemia, unspecified: Secondary | ICD-10-CM

## 2014-10-13 LAB — HEMOGLOBIN A1C: Hgb A1c MFr Bld: 12.2 % — ABNORMAL HIGH (ref 4.6–6.5)

## 2014-10-13 LAB — LIPID PANEL
CHOL/HDL RATIO: 8
Cholesterol: 270 mg/dL — ABNORMAL HIGH (ref 0–200)
HDL: 32.2 mg/dL — AB (ref >39.00)
NONHDL: 237.8
Triglycerides: 404 mg/dL — ABNORMAL HIGH (ref 0.0–149.0)
VLDL: 80.8 mg/dL — ABNORMAL HIGH (ref 0.0–40.0)

## 2014-10-13 LAB — LDL CHOLESTEROL, DIRECT: Direct LDL: 162 mg/dL

## 2014-10-15 HISTORY — PX: EYE SURGERY: SHX253

## 2014-10-20 ENCOUNTER — Other Ambulatory Visit (HOSPITAL_COMMUNITY)
Admission: RE | Admit: 2014-10-20 | Discharge: 2014-10-20 | Disposition: A | Discharge status: Home / Self Care | Payer: 59 | Source: Ambulatory Visit | Attending: Family Medicine | Admitting: Family Medicine

## 2014-10-20 ENCOUNTER — Ambulatory Visit (INDEPENDENT_AMBULATORY_CARE_PROVIDER_SITE_OTHER): Payer: 59 | Admitting: Family Medicine

## 2014-10-20 ENCOUNTER — Encounter: Payer: Self-pay | Admitting: Family Medicine

## 2014-10-20 VITALS — BP 128/84 | HR 91 | Temp 98.3°F | Ht 63.0 in | Wt 179.2 lb

## 2014-10-20 DIAGNOSIS — E1165 Type 2 diabetes mellitus with hyperglycemia: Secondary | ICD-10-CM

## 2014-10-20 DIAGNOSIS — Z0000 Encounter for general adult medical examination without abnormal findings: Secondary | ICD-10-CM

## 2014-10-20 DIAGNOSIS — N390 Urinary tract infection, site not specified: Secondary | ICD-10-CM

## 2014-10-20 DIAGNOSIS — Z01419 Encounter for gynecological examination (general) (routine) without abnormal findings: Secondary | ICD-10-CM | POA: Diagnosis not present

## 2014-10-20 DIAGNOSIS — I739 Peripheral vascular disease, unspecified: Secondary | ICD-10-CM

## 2014-10-20 DIAGNOSIS — F419 Anxiety disorder, unspecified: Secondary | ICD-10-CM

## 2014-10-20 DIAGNOSIS — IMO0002 Reserved for concepts with insufficient information to code with codable children: Secondary | ICD-10-CM

## 2014-10-20 DIAGNOSIS — E785 Hyperlipidemia, unspecified: Secondary | ICD-10-CM

## 2014-10-20 DIAGNOSIS — F329 Major depressive disorder, single episode, unspecified: Secondary | ICD-10-CM

## 2014-10-20 DIAGNOSIS — Z124 Encounter for screening for malignant neoplasm of cervix: Secondary | ICD-10-CM

## 2014-10-20 DIAGNOSIS — K219 Gastro-esophageal reflux disease without esophagitis: Secondary | ICD-10-CM

## 2014-10-20 MED ORDER — PITAVASTATIN CALCIUM 1 MG PO TABS: 1.0000 mg | ORAL_TABLET | Freq: Every day | ORAL | Status: DC

## 2014-10-20 MED ORDER — HYDROCODONE-ACETAMINOPHEN 10-325 MG PO TABS: 1.0000 | ORAL_TABLET | Freq: Two times a day (BID) | ORAL | Status: DC | PRN

## 2014-10-20 MED ORDER — INSULIN LISPRO 100 UNIT/ML (KWIKPEN): PEN_INJECTOR | SUBCUTANEOUS | Status: DC

## 2014-10-20 NOTE — Progress Notes (Signed)
Pre visit review using our clinic review tool, if applicable. No additional management support is needed unless otherwise documented below in the visit note. 

## 2014-10-20 NOTE — Patient Instructions (Addendum)
Rel of rec [facility] last 6 months Rel of Rec [facility] last 6 months   .Basic Carbohydrate Counting for Diabetes Mellitus Carbohydrate counting is a method for keeping track of the amount of carbohydrates you eat. Eating carbohydrates naturally increases the level of sugar (glucose) in your blood, so it is important for you to know the amount that is okay for you to have in every meal. Carbohydrate counting helps keep the level of glucose in your blood within normal limits. The amount of carbohydrates allowed is different for every person. A dietitian can help you calculate the amount that is right for you. Once you know the amount of carbohydrates you can have, you can count the carbohydrates in the foods you want to eat. Carbohydrates are found in the following foods:  Grains, such as breads and cereals.  Dried beans and soy products.  Starchy vegetables, such as potatoes, peas, and corn.  Fruit and fruit juices.  Milk and yogurt.  Sweets and snack foods, such as cake, cookies, candy, chips, soft drinks, and fruit drinks. CARBOHYDRATE COUNTING There are two ways to count the carbohydrates in your food. You can use either of the methods or a combination of both. Reading the "Nutrition Facts" on Packaged Food The "Nutrition Facts" is an area that is included on the labels of almost all packaged food and beverages in the [place]. It includes the serving size of that food or beverage and information about the nutrients in each serving of the food, including the grams (g) of carbohydrate per serving.  Decide the number of servings of this food or beverage that you will be able to eat or drink. Multiply that number of servings by the number of grams of carbohydrate that is listed on the label for that serving. The total will be the amount of carbohydrates you will be having when you eat or drink this food or beverage. Learning Standard Serving Sizes of Food When you eat food  that is not packaged or does not include "Nutrition Facts" on the label, you need to measure the servings in order to count the amount of carbohydrates.A serving of most carbohydrate-rich foods contains about 15 g of carbohydrates. The following list includes serving sizes of carbohydrate-rich foods that provide 15 g ofcarbohydrate per serving:   1 slice of bread (1 oz) or 1 six-inch tortilla.    of a hamburger bun or English muffin.  4-6 crackers.   cup unsweetened dry cereal.    cup hot cereal.   cup rice or pasta.    cup mashed potatoes or  of a large baked potato.  1 cup fresh fruit or one small piece of fruit.    cup canned or frozen fruit or fruit juice.  1 cup milk.   cup plain fat-free yogurt or yogurt sweetened with artificial sweeteners.   cup cooked dried beans or starchy vegetable, such as peas, corn, or potatoes.  Decide the number of standard-size servings that you will eat. Multiply that number of servings by 15 (the grams of carbohydrates in that serving). For example, if you eat 2 cups of strawberries, you will have eaten 2 servings and 30 g of carbohydrates (2 servings x 15 g = 30 g). For foods such as soups and casseroles, in which more than one food is mixed in, you will need to count the carbohydrates in each food that is included. EXAMPLE OF CARBOHYDRATE COUNTING Sample Dinner  3 oz chicken breast.   cup  of brown rice.   cup of corn.  1 cup milk.   1 cup strawberries with sugar-free whipped topping.  Carbohydrate Calculation Step 1: Identify the foods that contain carbohydrates:   Rice.   Corn.   Milk.   Strawberries. Step 2:Calculate the number of servings eaten of each:   2 servings of rice.   1 serving of corn.   1 serving of milk.   1 serving of strawberries. Step 3: Multiply each of those number of servings by 15 g:   2 servings of rice x 15 g = 30 g.   1 serving of corn x 15 g = 15 g.   1 serving  of milk x 15 g = 15 g.   1 serving of strawberries x 15 g = 15 g. Step 4: Add together all of the amounts to find the total grams of carbohydrates eaten: 30 g + 15 g + 15 g + 15 g = 75 g. Document Released: 09/19/2005 Document Revised: 02/03/2014 Document Reviewed: 08/16/2013 [facility] Patient Information 2015 [place], [place]. This information is not intended to replace advice given to you by your health care provider. Make sure you discuss any questions you have with your health care provider.

## 2014-10-21 LAB — CYTOLOGY - PAP

## 2014-10-23 ENCOUNTER — Other Ambulatory Visit: Payer: Self-pay

## 2014-10-23 DIAGNOSIS — B379 Candidiasis, unspecified: Secondary | ICD-10-CM

## 2014-10-23 MED ORDER — FLUCONAZOLE 150 MG PO TABS: 150.0000 mg | ORAL_TABLET | ORAL | Status: DC

## 2014-10-26 ENCOUNTER — Encounter: Payer: Self-pay | Admitting: Family Medicine

## 2014-10-26 DIAGNOSIS — Z0000 Encounter for general adult medical examination without abnormal findings: Secondary | ICD-10-CM

## 2014-10-26 DIAGNOSIS — N390 Urinary tract infection, site not specified: Secondary | ICD-10-CM | POA: Insufficient documentation

## 2014-10-26 HISTORY — DX: Urinary tract infection, site not specified: N39.0

## 2014-10-26 HISTORY — DX: Encounter for general adult medical examination without abnormal findings: Z00.00

## 2014-10-26 NOTE — Assessment & Plan Note (Signed)
Doing well on Sertraline 

## 2014-10-26 NOTE — Assessment & Plan Note (Signed)
Pap today, no concerns on exam.  

## 2014-10-26 NOTE — Progress Notes (Signed)
Patient ID: Dawn Cabrera, female   DOB: [birth year], 49 y.o.   MRN: [id number]   Dawn Cabrera  [id number] [birth year] 10/26/2014      Progress Note-Follow Up  Subjective  Chief Complaint  Chief Complaint  Patient presents with  . Annual Exam    fasting-pap smear    HPI  Patient is a 49 y.o. female in today for routine medical care. Is here for annual exam. Has had a very stressful year. Took care of her ex husband as he died of cancer in her home. She has had a very bad diabetic ulcer and has been following with the wound center at [facility]. Is healing well. Has had eye surgery to remove cataracts. Has had 3 stents in right leg. Denies CP/palp/SOB/HA/congestion/fevers/GI or GU c/o. Taking meds as prescribed  Past Medical History  Diagnosis Date  . Hyperlipidemia   . Thyroid disease   . Ulcer   . Diabetes mellitus without complication 10 years    type 2   . Chicken pox as child  . Polycystic ovarian syndrome 99  . DDD (degenerative disc disease)     low back pain  . Diabetes 10/13/2013  . PVD (peripheral vascular disease) 01/01/2014    Right leg S/p 3 stents in right leg at [facility] in March of 2015  . 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 Other and unspecified hyperlipidemia 06/15/2014  . Preventative health care 10/26/2014    Past Surgical History  Procedure Laterality Date  . Abdominal hysterectomy  05-28-98    total  . Cesarean section  10-10-85  . Abdominal exploration surgery  1999    before hysterectomy  . Toe amputation Right 12/18/13  . Vascular surgery  08-05-14,09-03-14  . Eye surgery  10-15-14    Family History  Problem Relation Age of Onset  . COPD Mother 23  . Diabetes Mother     type 2  . Alcohol abuse Mother   . Thyroid disease Daughter   . Cancer Maternal Grandmother     unsure  . Alcohol abuse Maternal Grandmother   . Alcohol abuse Father   . Alcohol abuse Brother   . Alcohol abuse Maternal Grandfather   . Alcohol abuse Brother     . Alcohol abuse Brother     History   Social History  . Marital Status: Divorced    Spouse Name: N/A    Number of Children: N/A  . Years of Education: N/A   Occupational History  . Not on file.   Social History Main Topics  . Smoking status: Former Smoker -- 0.50 packs/day for 34 years    Types: Cigarettes    Quit date: 12/18/2013  . Smokeless tobacco: Never Used  . Alcohol Use: No  . Drug Use: No  . Sexual Activity:    Partners: Male   Other Topics Concern  . Not on file   Social History Narrative    Current Outpatient Prescriptions on File Prior to Visit  Medication Sig Dispense Refill  . aspirin 81 MG tablet Take 81 mg by mouth daily.    . clopidogrel (PLAVIX) 75 MG tablet Take 1 tablet (75 mg total) by mouth once. 90 tablet 1  . ergocalciferol (VITAMIN D2) 50000 UNITS capsule Take 50,000 Units by mouth once a week.    . Insulin Detemir (LEVEMIR FLEXPEN) 100 UNIT/ML Pen Inject 50 Units into the skin daily at 10 pm. 15 mL 5  . levothyroxine (SYNTHROID, LEVOTHROID) 200 MCG tablet Take  1 tablet (200 mcg total) by mouth daily before breakfast. 30 tablet 3  . metformin (FORTAMET) 1000 MG (OSM) 24 hr tablet Take 1 tablet (1,000 mg total) by mouth 2 (two) times daily with a meal. 60 tablet 3  . saxagliptin HCl (ONGLYZA) 5 MG TABS tablet Take 1 tablet (5 mg total) by mouth daily. 30 tablet 3  . sertraline (ZOLOFT) 50 MG tablet TAKE ONE TABLET BY MOUTH ONCE DAILY 30 tablet 2   No current facility-administered medications on file prior to visit.    No Known Allergies  Review of Systems  Review of Systems  Constitutional: Positive for malaise/fatigue. Negative for fever.  HENT: Negative for congestion.   Eyes: Negative for discharge.  Respiratory: Negative for shortness of breath.   Cardiovascular: Negative for chest pain, palpitations and leg swelling.  Gastrointestinal: Negative for nausea, abdominal pain and diarrhea.  Genitourinary: Negative for dysuria.   Musculoskeletal: Negative for falls.  Skin: Negative for rash.  Neurological: Negative for loss of consciousness and headaches.  Endo/Heme/Allergies: Negative for polydipsia.  Psychiatric/Behavioral: Positive for depression. Negative for suicidal ideas. The patient is not nervous/anxious and does not have insomnia.     Objective  BP 128/84 mmHg  Pulse 91  Temp(Src) 98.3 F (36.8 C) (Oral)  Ht  (1.6 m)  Wt 179 lb 3.2 oz (81.285 kg)  BMI 31.75 kg/m2  SpO2 99%  LMP 05/28/1998  Physical Exam  Physical Exam  Constitutional: She is oriented to person, place, and time and well-developed, well-nourished, and in no distress. No distress.  HENT:  Head: Normocephalic and atraumatic.  Right Ear: External ear normal.  Left Ear: External ear normal.  Nose: Nose normal.  Mouth/Throat: Oropharynx is clear and moist. No oropharyngeal exudate.  Eyes: Conjunctivae are normal. Pupils are equal, round, and reactive to light. Right eye exhibits no discharge. Left eye exhibits no discharge. No scleral icterus.  Neck: Normal range of motion. Neck supple. No thyromegaly present.  Cardiovascular: Normal rate, regular rhythm, normal heart sounds and intact distal pulses.   No murmur heard. Pulmonary/Chest: Effort normal and breath sounds normal. No respiratory distress. She has no wheezes. She has no rales.  Abdominal: Soft. Bowel sounds are normal. She exhibits no distension and no mass. There is no tenderness.  Musculoskeletal: Normal range of motion. She exhibits no edema or tenderness.  Lymphadenopathy:    She has no cervical adenopathy.  Neurological: She is alert and oriented to person, place, and time. She has normal reflexes. No cranial nerve deficit. Coordination normal.  Skin: Skin is warm and dry. No rash noted. She is not diaphoretic.  Psychiatric: Mood, memory and affect normal.    Lab Results  Component Value Date   TSH 3.26 06/10/2014   Lab Results  Component Value Date    WBC 11.0* 06/10/2014   HGB 12.8 06/10/2014   HCT 38.0 06/10/2014   MCV 88.7 06/10/2014   PLT 324.0 06/10/2014   Lab Results  Component Value Date   CREATININE 0.9 06/10/2014   BUN 24* 06/10/2014   NA 138 06/10/2014   K 5.1 06/10/2014   CL 101 06/10/2014   CO2 28 06/10/2014   Lab Results  Component Value Date   ALT 15 06/10/2014   AST 16 06/10/2014   ALKPHOS 112 06/10/2014   BILITOT 0.4 06/10/2014   Lab Results  Component Value Date   CHOL 270* 10/13/2014   Lab Results  Component Value Date   HDL 32.20* 10/13/2014   Lab Results  Component Value Date   LDLCALC 151* 02/20/2014   Lab Results  Component Value Date   TRIG 404.0* 10/13/2014   Lab Results  Component Value Date   CHOLHDL 8 10/13/2014     Assessment & Plan  Diabetes type 2, uncontrolled Offered referral to endocrinology, declines for now. hgba1c unacceptable, minimize simple carbs. Increase exercise as tolerated. Continue current meds but increase Levemir by 2 units every 3 days til she reaches 60 units then will have to start increasing her short acting insulin based on FSG levels   Gastro-esophageal reflux Avoid offending foods, start probiotics. Do not eat large meals in late evening and consider raising head of bed.    Hyperlipidemia Will try Livalo has not tolerated any other statins, has had a bad reaction to Atorvastatin, Lovastatin, Crestor in past. Will try Livalo by starting with 1 g tabs and prescribing just 1/2 tab twice a week and increse as tolerated.   PVD (peripheral vascular disease) Will request records from vascular for past several 6 months   Anxiety and depression Doing well on Sertraline   Cervical cancer screening Pap today, no concerns on exam.    Preventative health care Patient encouraged to maintain heart healthy diet, regular exercise, adequate sleep. Consider daily probiotics. Take medications as prescribed

## 2014-10-26 NOTE — Assessment & Plan Note (Signed)
Avoid offending foods, start probiotics. Do not eat large meals in late evening and consider raising head of bed.  

## 2014-10-26 NOTE — Assessment & Plan Note (Signed)
Will try Livalo has not tolerated any other statins, has had a bad reaction to Atorvastatin, Lovastatin, Crestor in past. Will try Livalo by starting with 1 g tabs and prescribing just 1/2 tab twice a week and increse as tolerated.

## 2014-10-26 NOTE — Assessment & Plan Note (Signed)
Patient encouraged to maintain heart healthy diet, regular exercise, adequate sleep. Consider daily probiotics. Take medications as prescribed 

## 2014-10-26 NOTE — Assessment & Plan Note (Signed)
Offered referral to endocrinology, declines for now. hgba1c unacceptable, minimize simple carbs. Increase exercise as tolerated. Continue current meds but increase Levemir by 2 units every 3 days til she reaches 60 units then will have to start increasing her short acting insulin based on FSG levels

## 2014-10-26 NOTE — Assessment & Plan Note (Signed)
Has had numerous infections this year no symptoms at present. Encouraged hydration and probiotics.

## 2014-10-26 NOTE — Assessment & Plan Note (Signed)
Will request records from vascular for past several 6 months

## 2014-11-18 ENCOUNTER — Ambulatory Visit: Payer: 59 | Admitting: Family Medicine

## 2014-11-19 ENCOUNTER — Telehealth: Payer: Self-pay

## 2014-11-19 NOTE — Telephone Encounter (Signed)
Admission Date: 11/11/14----[facility]  Discharge Date:  11/15/14 Medical records requested  Reason for admission:  Right foot, great toe amputation  Pt states she's doing well.  Denies fever, redness, and swelling of right foot and leg.  She is experiencing pain.  Pain controlled with Norco.  She is able to walk with walker.  Son is there to help as needed.  Per patient--dressing clean, dry and intact.  Drg changed yesterday evening by a CMA friend.  Dressing is changed daily.  Follow up with surgeon, Dr. Rosina LowensteinLucas--[place], on 11/25/14 @ 7:45 am.    Dual lumen picc line right upper arm---no redness, swelling, or drainage.  Drsg changed yesterday.  Pt flushing ports as instructed.    No needs at this time.

## 2014-11-20 ENCOUNTER — Telehealth: Payer: Self-pay | Admitting: *Deleted

## 2014-11-20 NOTE — Telephone Encounter (Signed)
Medical records received via fax from [facility] and forwarded to Ashlee. JG//CMA 

## 2014-11-20 NOTE — Telephone Encounter (Signed)
Discharge summary reviewed and placed in Dawn Cabrera's red folder for review and initial.   

## 2014-11-23 ENCOUNTER — Other Ambulatory Visit: Payer: Self-pay | Admitting: Family

## 2014-11-23 ENCOUNTER — Other Ambulatory Visit: Payer: Self-pay | Admitting: Family Medicine

## 2014-11-24 ENCOUNTER — Ambulatory Visit: Payer: 59 | Admitting: Physician Assistant

## 2014-11-24 ENCOUNTER — Ambulatory Visit (INDEPENDENT_AMBULATORY_CARE_PROVIDER_SITE_OTHER): Payer: 59 | Admitting: Physician Assistant

## 2014-11-24 ENCOUNTER — Encounter: Payer: Self-pay | Admitting: Physician Assistant

## 2014-11-24 VITALS — BP 112/74 | HR 85 | Temp 98.4°F | Resp 16 | Ht 63.0 in | Wt 176.0 lb

## 2014-11-24 DIAGNOSIS — S98111A Complete traumatic amputation of right great toe, initial encounter: Secondary | ICD-10-CM

## 2014-11-24 DIAGNOSIS — Z89619 Acquired absence of unspecified leg above knee: Secondary | ICD-10-CM | POA: Insufficient documentation

## 2014-11-24 DIAGNOSIS — IMO0002 Reserved for concepts with insufficient information to code with codable children: Secondary | ICD-10-CM

## 2014-11-24 DIAGNOSIS — E1165 Type 2 diabetes mellitus with hyperglycemia: Secondary | ICD-10-CM

## 2014-11-24 DIAGNOSIS — Z89411 Acquired absence of right great toe: Secondary | ICD-10-CM

## 2014-11-24 HISTORY — DX: Acquired absence of unspecified leg above knee: Z89.619

## 2014-11-24 MED ORDER — HYDROCODONE-ACETAMINOPHEN 10-325 MG PO TABS: 1.0000 | ORAL_TABLET | Freq: Four times a day (QID) | ORAL | Status: DC | PRN

## 2014-11-24 NOTE — Assessment & Plan Note (Signed)
Patient doing well overall.  Pain medication refilled with Q6-8H dosing. Continue daily dressing changes.  Follow-up with the Wound Care Clinic and the ID Specialist as directed.  Alarm signs/symptoms discussed with the patient. Follow-up as scheduled.

## 2014-11-24 NOTE — Assessment & Plan Note (Signed)
Continue Levemir titration as directed by PCP until AM GBG is 100-130.  Minimize carbs.  Stay as active as possible. Continue all other medications as directed.  Follow-up as scheduled.

## 2014-11-24 NOTE — Progress Notes (Signed)
Patient presents to clinic today for Hospital Follow-up of Osteomyelitis and amputation of great R toe.  Patent was admitted to [facility] on 11/11/14 by her Wound Care center for further management of foot ulcer as patient was not responsive to treatment and began having fevers.  Osteomyelitis was confirmed and amputation took place without complication.  Patient placed on IV abx until stable in hospital.  Was discharged on 11/15/2014, with instruction to finish course of PO Ciprofloxacin 500 mg BID and 500 mg Daptomycin BID through her PICC line.  Endorses taking medications as directed.  Is not having vaginal yeast infection from antibiotic use.  Endorses pain has stayed around a 5-6/10 over the past week but has improved from previous weeks. Denies fever, chills or purulent drainage from foot.  Has a home nurse who is changing packing daily. Has scheduled follow-up at Wound Care center first thing tomorrow morning.  Is seeing the ID specialist in 2 weeks.  Patient has been titrating up her Levemir in the evening as directed by her PCP.  Currently with FBG ~ 150-160 which is marked improvement from what has been documented at previous visits. Is trying to watch her diet.  Is ambulating with a walker without much difficulty.  Past Medical History  Diagnosis Date  . Hyperlipidemia   . Thyroid disease   . Ulcer   . Diabetes mellitus without complication 10 years    type 2   . Chicken pox as child  . Polycystic ovarian syndrome 99  . DDD (degenerative disc disease)     low back pain  . Diabetes 10/13/2013  . PVD (peripheral vascular disease) 01/01/2014    Right leg S/p 3 stents in right leg at [facility] in March of 2015  . 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 Other and unspecified hyperlipidemia 06/15/2014  . Preventative health care 10/26/2014  . UTI (urinary tract infection) 10/26/2014    Current Outpatient Prescriptions on File Prior to Visit  Medication Sig Dispense Refill  . aspirin  81 MG tablet Take 81 mg by mouth daily.    . ciprofloxacin (CIPRO) 500 MG tablet Take 500 mg by mouth 2 (two) times daily.    . clopidogrel (PLAVIX) 75 MG tablet Take 1 tablet (75 mg total) by mouth once. 90 tablet 1  . DAPTOmycin (CUBICIN) 500 MG injection Inject 500 mg into the vein daily.    . ergocalciferol (VITAMIN D2) 50000 UNITS capsule Take 50,000 Units by mouth once a week.    . Insulin Detemir (LEVEMIR FLEXPEN) 100 UNIT/ML Pen Inject 50 Units into the skin daily at 10 pm. (Patient taking differently: Inject 54 Units into the skin every morning. ) 15 mL 5  . insulin lispro (HUMALOG) 100 UNIT/ML KiwkPen Check sugars tid with meals FSG<200 no units 201-250 take 2 units 251-300 take 4 units 301-350 take 6 units 351-400 take 8 units  401 to 450 take 10 units 451 or greater call MD 15 mL 11  . levothyroxine (SYNTHROID, LEVOTHROID) 200 MCG tablet TAKE ONE TABLET BY MOUTH ONCE DAILY BEFORE BREAKFAST 30 tablet 2  . metformin (FORTAMET) 1000 MG (OSM) 24 hr tablet TAKE ONE TABLET BY MOUTH TWICE DAILY WITH  A  MEAL 60 tablet 11  . ONGLYZA 5 MG TABS tablet TAKE ONE TABLET BY MOUTH ONCE DAILY 30 tablet 2  . Pitavastatin Calcium (LIVALO) 1 MG TABS Take 1 tablet (1 mg total) by mouth daily. 30 tablet 3  . sertraline (ZOLOFT)  50 MG tablet TAKE ONE TABLET BY MOUTH ONCE DAILY 30 tablet 2   No current facility-administered medications on file prior to visit.    No Known Allergies  Family History  Problem Relation Age of Onset  . COPD Mother 58  . Diabetes Mother     type 2  . Alcohol abuse Mother   . Thyroid disease Daughter   . Cancer Maternal Grandmother     unsure  . Alcohol abuse Maternal Grandmother   . Alcohol abuse Father   . Alcohol abuse Brother   . Alcohol abuse Maternal Grandfather   . Alcohol abuse Brother   . Alcohol abuse Brother     History   Social History  . Marital Status: Divorced    Spouse Name: N/A  . Number of Children: N/A  . Years of Education: N/A    Social History Main Topics  . Smoking status: Former Smoker -- 0.50 packs/day for 34 years    Types: Cigarettes    Quit date: 12/18/2013  . Smokeless tobacco: Never Used  . Alcohol Use: No  . Drug Use: No  . Sexual Activity:    Partners: Male   Other Topics Concern  . None   Social History Narrative   Review of Systems - See HPI.  All other ROS are negative.  BP 112/74 mmHg  Pulse 85  Temp(Src) 98.4 F (36.9 C) (Oral)  Resp 16  Ht  (1.6 m)  Wt 176 lb (79.833 kg)  BMI 31.18 kg/m2  SpO2 98%  LMP 05/28/1998  Physical Exam  Constitutional: She is oriented to person, place, and time and well-developed, well-nourished, and in no distress.  HENT:  Head: Normocephalic and atraumatic.  Eyes: Conjunctivae are normal. Pupils are equal, round, and reactive to light.  Cardiovascular: Normal rate, regular rhythm, normal heart sounds and intact distal pulses.   Pulmonary/Chest: Effort normal and breath sounds normal. No respiratory distress. She has no wheezes. She has no rales. She exhibits no tenderness.  Neurological: She is alert and oriented to person, place, and time.  Skin: Skin is warm and dry. No rash noted.  Psychiatric: Affect normal.  Vitals reviewed.   Recent Results (from the past 2160 hour(s))  Lipid panel     Status: Abnormal   Collection Time: 10/13/14  9:22 AM  Result Value Ref Range   Cholesterol 270 (H) 0 - 200 mg/dL    Comment: ATP III Classification       Desirable:  < 200 mg/dL               Borderline High:  200 - 239 mg/dL          High:  > = 086 mg/dL   Triglycerides 578.4 (H) 0.0 - 149.0 mg/dL    Comment: Normal:  <696 mg/dLBorderline High:  150 - 199 mg/dLTriglyceride is over 400; calculations on Lipids are invalid.   HDL 32.20 (L) >39.00 mg/dL   VLDL 29.5 (H) 0.0 - 28.4 mg/dL   Total CHOL/HDL Ratio 8     Comment:                Men          Women1/2 Average Risk     3.4          3.3Average Risk          5.0          4.42X Average Risk  9.6          7.13X Average Risk          15.0          11.0                       NonHDL 237.80     Comment: NOTE:  Non-HDL goal should be 30 mg/dL higher than patient's LDL goal (i.e. LDL goal of < 70 mg/dL, would have non-HDL goal of < 100 mg/dL)  Hemoglobin W0JA1c     Status: Abnormal   Collection Time: 10/13/14  9:22 AM  Result Value Ref Range   Hgb A1c MFr Bld 12.2 (H) 4.6 - 6.5 %    Comment: Glycemic Control Guidelines for People with Diabetes:Non Diabetic:  <6%Goal of Therapy: <7%Additional Action Suggested:  >8%   LDL cholesterol, direct     Status: None   Collection Time: 10/13/14  9:22 AM  Result Value Ref Range   Direct LDL 162.0 mg/dL    Comment: Optimal:  <811<100 mg/dLNear or Above Optimal:  100-129 mg/dLBorderline High:  130-159 mg/dLHigh:  160-189 mg/dLVery High:  >190 mg/dL  Cytology - PAP     Status: None   Collection Time: 10/20/14 12:00 AM  Result Value Ref Range   CYTOLOGY - PAP PAP RESULT     Assessment/Plan: Lower limb amputation, great toe Patient doing well overall.  Pain medication refilled with Q6-8H dosing. Continue daily dressing changes.  Follow-up with the Wound Care Clinic and the ID Specialist as directed.  Alarm signs/symptoms discussed with the patient. Follow-up as scheduled.   Diabetes type 2, uncontrolled Continue Levemir titration as directed by PCP until AM GBG is 100-130.  Minimize carbs.  Stay as active as possible. Continue all other medications as directed.  Follow-up as scheduled.

## 2014-11-24 NOTE — Patient Instructions (Signed)
Please continue the antibiotics as directed.  I have sent Diflucan to your pharmacy to take as directed.  Stay well hydrated and well nourished.  Make sure to follow-up with the Wound Care center tomorrow as scheduled.  Also, make sure to go to your appointment with the Infectious Disease Specialist.  If at anytime you notice fever, chills, vomiting or increased pain, redness and swelling or amputations site, please go to the ER for evaluation.

## 2014-11-26 ENCOUNTER — Encounter: Payer: Self-pay | Admitting: Family Medicine

## 2014-11-26 ENCOUNTER — Telehealth: Payer: Self-pay | Admitting: Family Medicine

## 2014-11-26 NOTE — Telephone Encounter (Signed)
Patient Instructions     Please continue the antibiotics as directed. I have sent Diflucan to your pharmacy to take as directed. Stay well hydrated and well nourished.  Make sure to follow-up with the Wound Care center tomorrow as scheduled. Also, make sure to go to your appointment with the Infectious Disease Specialist.  If at anytime you notice fever, chills, vomiting or increased pain, redness and swelling or amputations site, please go to the ER for evaluation.    Please Advise on dosing directions for Diflucan/SLS

## 2014-11-26 NOTE — Telephone Encounter (Signed)
Caller name: Romeo AppleCollins, Tuyen Relation to pt: self  Call back number: [phone] [facility] [address] - [place], [place] - [address], [address] [phone] (Phone) [phone] (Fax)         Reason for call:  Pt is checking on the status of diflucan for her yeast infection.

## 2014-11-27 MED ORDER — FLUCONAZOLE 150 MG PO TABS: 150.0000 mg | ORAL_TABLET | Freq: Once | ORAL | Status: DC

## 2014-11-27 NOTE — Telephone Encounter (Signed)
Directions given.  Rx sent.

## 2014-11-27 NOTE — Telephone Encounter (Signed)
Left a message for call back.  

## 2014-11-27 NOTE — Telephone Encounter (Signed)
Take 1 tablet by mouth as directed.  Can repeat in 3-4 days if needed.

## 2014-12-02 NOTE — Telephone Encounter (Signed)
Pt seen by Malva Coganody Martin, PA-C on 11/24/14.

## 2014-12-31 ENCOUNTER — Telehealth: Payer: Self-pay | Admitting: Family Medicine

## 2014-12-31 DIAGNOSIS — S98111A Complete traumatic amputation of right great toe, initial encounter: Secondary | ICD-10-CM

## 2014-12-31 MED ORDER — HYDROCODONE-ACETAMINOPHEN 10-325 MG PO TABS: 1.0000 | ORAL_TABLET | Freq: Four times a day (QID) | ORAL | Status: DC | PRN

## 2014-12-31 NOTE — Telephone Encounter (Signed)
Pt seen 11/24/14 for hospital f/u and received RX #120. Pt has no future appts scheduled. Rx printed and forwarded to PA, Daphine DeutscherMartin for signature.

## 2014-12-31 NOTE — Telephone Encounter (Signed)
Caller name: Lorry °Relation to pt: self °Call back number: [phone] °Pharmacy: ° °Reason for call:  ° °Requesting norco rx °

## 2014-12-31 NOTE — Telephone Encounter (Signed)
Rx placed at front desk and detailed message left on pt's cell#.

## 2014-12-31 NOTE — Telephone Encounter (Signed)
Refill granted 

## 2015-01-22 ENCOUNTER — Other Ambulatory Visit: Payer: Self-pay | Admitting: Family Medicine

## 2015-02-06 ENCOUNTER — Other Ambulatory Visit: Payer: Self-pay | Admitting: Family Medicine

## 2015-02-06 DIAGNOSIS — S98111A Complete traumatic amputation of right great toe, initial encounter: Secondary | ICD-10-CM

## 2015-02-06 MED ORDER — HYDROCODONE-ACETAMINOPHEN 10-325 MG PO TABS: 1.0000 | ORAL_TABLET | Freq: Four times a day (QID) | ORAL | Status: DC | PRN

## 2015-02-06 NOTE — Telephone Encounter (Signed)
i have approved but needs appt by July for any more

## 2015-02-06 NOTE — Telephone Encounter (Signed)
Caller name: Imanii Relation to pt: self Call back number: [phone] Pharmacy:  Reason for call:   Requesting norco rx

## 2015-02-06 NOTE — Telephone Encounter (Signed)
Called the patient informed hardcopy for hydrocodone is ready for pickup at the front desk and informed to schedule appt.

## 2015-02-06 NOTE — Telephone Encounter (Signed)
Last Office Visit 06/10/2014 Last Refill 12/31/14  #120 with 0 refills. Advise on Refill, Thanks

## 2015-03-05 ENCOUNTER — Telehealth: Payer: Self-pay | Admitting: Family Medicine

## 2015-03-05 NOTE — Telephone Encounter (Signed)
Relation to pt: self Call back number: [phone]   Reason for call:  Pt states she lost her job and insurance, pt inquiring about discount card to pay for her insulin lispro (HUMALOG) 100 UNIT/ML KiwkPen. Pt states she has 3 days left.

## 2015-03-05 NOTE — Telephone Encounter (Signed)
I have checked and there are no discount cards for humalog.

## 2015-03-05 NOTE — Telephone Encounter (Signed)
So try and switch her to regular insulin ([facility] in a vial disp #1 vial and with 2 rf

## 2015-03-06 NOTE — Telephone Encounter (Signed)
Called the patient back and she stated it is the levemir flexpen (not the humalog as initial msg. Stated) she can't get due to cost.

## 2015-03-07 NOTE — Telephone Encounter (Signed)
Please look and see if we have a Levemir that will hold her over til her appt in early July. Her hgba1c was bad back in January I need to repeat it before I decide which insulin to give her.

## 2015-03-09 NOTE — Telephone Encounter (Signed)
Called left msg. To call back 

## 2015-03-09 NOTE — Telephone Encounter (Signed)
Called the patient informed we have 2 boxes of levemir.  Patient did agree to pickup at her convenience. Did put in log book to pickup 2 boxes of Levemir flexpen

## 2015-03-10 ENCOUNTER — Telehealth: Payer: Self-pay | Admitting: Family Medicine

## 2015-03-10 NOTE — Telephone Encounter (Signed)
Caller name: Marri Relation to pt: self Call back number: [phone] Pharmacy:  Reason for call:   Patient states that she cannot drive right now and Carmelia RollerMelissa Bailey will be picking up her insulin

## 2015-03-18 ENCOUNTER — Ambulatory Visit (INDEPENDENT_AMBULATORY_CARE_PROVIDER_SITE_OTHER): Payer: Self-pay | Admitting: Physician Assistant

## 2015-03-18 ENCOUNTER — Encounter: Payer: Self-pay | Admitting: Physician Assistant

## 2015-03-18 VITALS — BP 122/78 | HR 74 | Temp 98.0°F | Resp 16 | Ht 63.0 in

## 2015-03-18 DIAGNOSIS — K0889 Other specified disorders of teeth and supporting structures: Secondary | ICD-10-CM

## 2015-03-18 DIAGNOSIS — K088 Other specified disorders of teeth and supporting structures: Secondary | ICD-10-CM

## 2015-03-18 NOTE — Progress Notes (Signed)
Patient presents to clinic today c/o tooth pain x 2 days stemming from a broken tooth.  Was evaluated by dentist 2 days ago where x-rays were performed.  Dentist recommended tooth be pulled but patient cannot afford. Patient denies fever, drainage or redness at site of tooth.  Wants to make sure she does not need antibiotic.  Past Medical History  Diagnosis Date  . Hyperlipidemia   . Thyroid disease   . Ulcer   . Diabetes mellitus without complication 10 years    type 2   . Chicken pox as child  . Polycystic ovarian syndrome 99  . DDD (degenerative disc disease)     low back pain  . Diabetes 10/13/2013  . PVD (peripheral vascular disease) 01/01/2014    Right leg S/p 3 stents in right leg at [facility] in March of 2015  . 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 Other and unspecified hyperlipidemia 06/15/2014  . Preventative health care 10/26/2014  . UTI (urinary tract infection) 10/26/2014    Current Outpatient Prescriptions on File Prior to Visit  Medication Sig Dispense Refill  . aspirin 81 MG tablet Take 81 mg by mouth daily.    . clopidogrel (PLAVIX) 75 MG tablet Take 1 tablet (75 mg total) by mouth once. 90 tablet 1  . HYDROcodone-acetaminophen (NORCO) 10-325 MG per tablet Take 1 tablet by mouth every 6 (six) hours as needed for moderate pain (foot, knee, back pain). 120 tablet 0  . LEVEMIR FLEXTOUCH 100 UNIT/ML Pen INFECT 50 UNITS INTO THE SKIN DAILY AT 10 PM (Patient taking differently: INFECT 54 UNITS INTO THE SKIN DAILY AT 10 PM) 15 mL 2  . levothyroxine (SYNTHROID, LEVOTHROID) 200 MCG tablet TAKE ONE TABLET BY MOUTH ONCE DAILY BEFORE BREAKFAST 30 tablet 2  . metformin (FORTAMET) 1000 MG (OSM) 24 hr tablet TAKE ONE TABLET BY MOUTH TWICE DAILY WITH  A  MEAL 60 tablet 11  . ONGLYZA 5 MG TABS tablet TAKE ONE TABLET BY MOUTH ONCE DAILY 30 tablet 2  . sertraline (ZOLOFT) 50 MG tablet TAKE ONE TABLET BY MOUTH ONCE DAILY 30 tablet 2   No current facility-administered medications on  file prior to visit.    Allergies  Allergen Reactions  . Livalo [Pitavastatin] Other (See Comments)    Body aches    Family History  Problem Relation Age of Onset  . COPD Mother 16  . Diabetes Mother     type 2  . Alcohol abuse Mother   . Thyroid disease Daughter   . Cancer Maternal Grandmother     unsure  . Alcohol abuse Maternal Grandmother   . Alcohol abuse Father   . Alcohol abuse Brother   . Alcohol abuse Maternal Grandfather   . Alcohol abuse Brother   . Alcohol abuse Brother     History   Social History  . Marital Status: Divorced    Spouse Name: N/A  . Number of Children: N/A  . Years of Education: N/A   Social History Main Topics  . Smoking status: Former Smoker -- 0.50 packs/day for 34 years    Types: Cigarettes    Quit date: 12/18/2013  . Smokeless tobacco: Never Used  . Alcohol Use: No  . Drug Use: No  . Sexual Activity:    Partners: Male   Other Topics Concern  . None   Social History Narrative   Review of Systems - See HPI.  All other ROS are negative.  BP 122/78 mmHg  Pulse  74  Temp(Src) 98 F (36.7 C) (Oral)  Resp 16  Ht  (1.6 m)  Wt   SpO2 97%  LMP 05/28/1998  Physical Exam  Constitutional: She is oriented to person, place, and time and well-developed, well-nourished, and in no distress.  HENT:  Head: Normocephalic and atraumatic.  Mouth/Throat: Oropharynx is clear and moist and mucous membranes are normal. No oral lesions. Normal dentition. Dental caries present. No dental abscesses.  Cardiovascular: Normal rate, regular rhythm, normal heart sounds and intact distal pulses.   Pulmonary/Chest: Effort normal.  Neurological: She is alert and oriented to person, place, and time.  Vitals reviewed.  Assessment/Plan: Pain, dental No sign of infection or abscess. Recommend good dental hygiene, Anbesol and clove oil. Increase hydrocodone to 2 tablets every 8 hours over the next couple of days.  Follow-up with dentist for  abstraction.

## 2015-03-18 NOTE — Progress Notes (Signed)
Pre visit review using our clinic review tool, if applicable. No additional management support is needed unless otherwise documented below in the visit note. 

## 2015-03-18 NOTE — Patient Instructions (Signed)
Please keep teeth cleaned.  Avoid chewing on affected side. Use Anbesol or clove oil to numb the area. You can take two of your Hydrocodone every 8 hours if needed over the next few days. Please follow-up with your dentist for removal. If you notice worsening pain, redness or swelling in the area follow-up immediately.

## 2015-03-18 NOTE — Assessment & Plan Note (Signed)
No sign of infection or abscess. Recommend good dental hygiene, Anbesol and clove oil. Increase hydrocodone to 2 tablets every 8 hours over the next couple of days.  Follow-up with dentist for abstraction.

## 2015-03-24 ENCOUNTER — Telehealth: Payer: Self-pay | Admitting: *Deleted

## 2015-03-24 NOTE — Telephone Encounter (Signed)
Pt signed ROI received via fax from [employer]. Forwarded to Jordan to scan/email to medical records. JG//CMA  

## 2015-04-13 ENCOUNTER — Encounter: Payer: Self-pay | Admitting: Family Medicine

## 2015-04-13 ENCOUNTER — Ambulatory Visit (INDEPENDENT_AMBULATORY_CARE_PROVIDER_SITE_OTHER): Payer: Self-pay | Admitting: Family Medicine

## 2015-04-13 VITALS — BP 118/69 | HR 92 | Temp 98.3°F | Ht 62.0 in | Wt 179.0 lb

## 2015-04-13 DIAGNOSIS — Z89519 Acquired absence of unspecified leg below knee: Secondary | ICD-10-CM

## 2015-04-13 DIAGNOSIS — E1169 Type 2 diabetes mellitus with other specified complication: Secondary | ICD-10-CM

## 2015-04-13 DIAGNOSIS — E1165 Type 2 diabetes mellitus with hyperglycemia: Secondary | ICD-10-CM

## 2015-04-13 DIAGNOSIS — S98111A Complete traumatic amputation of right great toe, initial encounter: Secondary | ICD-10-CM

## 2015-04-13 DIAGNOSIS — Z89411 Acquired absence of right great toe: Secondary | ICD-10-CM

## 2015-04-13 DIAGNOSIS — E079 Disorder of thyroid, unspecified: Secondary | ICD-10-CM

## 2015-04-13 DIAGNOSIS — E785 Hyperlipidemia, unspecified: Secondary | ICD-10-CM

## 2015-04-13 DIAGNOSIS — L089 Local infection of the skin and subcutaneous tissue, unspecified: Secondary | ICD-10-CM

## 2015-04-13 DIAGNOSIS — IMO0002 Reserved for concepts with insufficient information to code with codable children: Secondary | ICD-10-CM

## 2015-04-13 DIAGNOSIS — E11628 Type 2 diabetes mellitus with other skin complications: Secondary | ICD-10-CM

## 2015-04-13 HISTORY — DX: Acquired absence of right great toe: Z89.411

## 2015-04-13 HISTORY — DX: Acquired absence of unspecified leg below knee: Z89.519

## 2015-04-13 MED ORDER — HYDROCODONE-ACETAMINOPHEN 10-325 MG PO TABS: 1.0000 | ORAL_TABLET | Freq: Four times a day (QID) | ORAL | Status: DC | PRN

## 2015-04-13 NOTE — Patient Instructions (Signed)

## 2015-04-13 NOTE — Progress Notes (Signed)
Pre visit review using our clinic review tool, if applicable. No additional management support is needed unless otherwise documented below in the visit note. 

## 2015-04-26 ENCOUNTER — Encounter: Payer: Self-pay | Admitting: Family Medicine

## 2015-04-26 NOTE — Assessment & Plan Note (Signed)
On Levothyroxine, continue to monitor 

## 2015-04-26 NOTE — Assessment & Plan Note (Signed)
hgba1c unacceptable minimize simple carbs. Increase exercise as tolerated. Continue current meds but increase by 2 units every 3 days til numbers

## 2015-04-26 NOTE — Assessment & Plan Note (Signed)
Encouraged heart healthy diet, increase exercise, avoid trans fats, consider a krill oil cap daily 

## 2015-04-26 NOTE — Progress Notes (Signed)
Dawn Cabrera  [id number] 28-May-1966 04/26/2015      Progress Note-Follow Up  Subjective  Chief Complaint  Chief Complaint  Patient presents with  . Hospitalization Follow-up    HPI  Patient is a 49 y.o. female in today for routine medical care. Patient is in today for follow up. She continues to struggle with diabetic foot ulcers and has had several surgeries for toe amputations. She is healing well but has to follow regularly for debridement. No fevers, chills, myalgias. Is noting some polyuria, polydipsia. Denies CP/palp/SOB/HA/congestion/fevers/GI or GU c/o. Taking meds as prescribed  Past Medical History  Diagnosis Date  . Hyperlipidemia   . Thyroid disease   . Ulcer   . Diabetes mellitus without complication 10 years    type 2   . Chicken pox as child  . Polycystic ovarian syndrome 99  . DDD (degenerative disc disease)     low back pain  . Diabetes 10/13/2013  . PVD (peripheral vascular disease) 01/01/2014    Right leg S/p 3 stents in right leg at [facility] in March of 2015  . 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 Other and unspecified hyperlipidemia 06/15/2014  . Preventative health care 10/26/2014  . UTI (urinary tract infection) 10/26/2014  . Status post amputation of right great toe 04/13/2015    Past Surgical History  Procedure Laterality Date  . Abdominal hysterectomy  05-28-98    total  . Cesarean section  10-10-85  . Abdominal exploration surgery  1999    before hysterectomy  . Toe amputation Right 12/18/13  . Vascular surgery  08-05-14,09-03-14  . Eye surgery  10-15-14    Family History  Problem Relation Age of Onset  . COPD Mother 22  . Diabetes Mother     type 2  . Alcohol abuse Mother   . Thyroid disease Daughter   . Cancer Maternal Grandmother     unsure  . Alcohol abuse Maternal Grandmother   . Alcohol abuse Father   . Alcohol abuse Brother   . Alcohol abuse Maternal Grandfather   . Alcohol abuse Brother   . Alcohol abuse Brother      History   Social History  . Marital Status: Divorced    Spouse Name: N/A  . Number of Children: N/A  . Years of Education: N/A   Occupational History  . Not on file.   Social History Main Topics  . Smoking status: Former Smoker -- 0.50 packs/day for 34 years    Types: Cigarettes    Quit date: 12/18/2013  . Smokeless tobacco: Never Used  . Alcohol Use: No  . Drug Use: No  . Sexual Activity:    Partners: Male   Other Topics Concern  . Not on file   Social History Narrative    Current Outpatient Prescriptions on File Prior to Visit  Medication Sig Dispense Refill  . aspirin 81 MG tablet Take 81 mg by mouth daily.    . clopidogrel (PLAVIX) 75 MG tablet Take 1 tablet (75 mg total) by mouth once. 90 tablet 1  . insulin aspart (NOVOLOG) 100 UNIT/ML injection Pt states she takes as needed per sliding scale.    Marland Kitchen LEVEMIR FLEXTOUCH 100 UNIT/ML Pen INFECT 50 UNITS INTO THE SKIN DAILY AT 10 PM (Patient taking differently: INFECT 54 UNITS INTO THE SKIN DAILY AT 10 PM) 15 mL 2  . levothyroxine (SYNTHROID, LEVOTHROID) 200 MCG tablet TAKE ONE TABLET BY MOUTH ONCE DAILY BEFORE BREAKFAST 30 tablet 2  .  metformin (FORTAMET) 1000 MG (OSM) 24 hr tablet TAKE ONE TABLET BY MOUTH TWICE DAILY WITH  A  MEAL 60 tablet 11  . ONGLYZA 5 MG TABS tablet TAKE ONE TABLET BY MOUTH ONCE DAILY 30 tablet 2  . sertraline (ZOLOFT) 50 MG tablet TAKE ONE TABLET BY MOUTH ONCE DAILY 30 tablet 2  . Vitamin D, Cholecalciferol, 1000 UNITS CAPS Take 2,000 mg by mouth daily.     No current facility-administered medications on file prior to visit.    Allergies  Allergen Reactions  . Livalo [Pitavastatin] Other (See Comments)    Body aches    Review of Systems  Review of Systems  Constitutional: Negative for fever and malaise/fatigue.  HENT: Negative for congestion.   Eyes: Negative for discharge.  Respiratory: Negative for shortness of breath.   Cardiovascular: Negative for chest pain, palpitations and  leg swelling.  Gastrointestinal: Negative for nausea, abdominal pain and diarrhea.  Genitourinary: Negative for dysuria.  Musculoskeletal: Negative for falls.  Skin: Negative for rash.  Neurological: Negative for loss of consciousness and headaches.  Endo/Heme/Allergies: Negative for polydipsia.  Psychiatric/Behavioral: Negative for depression and suicidal ideas. The patient is not nervous/anxious and does not have insomnia.     Objective  BP 118/69 mmHg  Pulse 92  Temp(Src) 98.3 F (36.8 C) (Oral)  Ht  (1.575 m)  Wt 179 lb (81.194 kg)  BMI 32.73 kg/m2  SpO2 99%  LMP 05/28/1998  Physical Exam  Physical Exam  Constitutional: She is oriented to person, place, and time and well-developed, well-nourished, and in no distress. No distress.  HENT:  Head: Normocephalic and atraumatic.  Eyes: Conjunctivae are normal.  Neck: Neck supple. No thyromegaly present.  Cardiovascular: Normal rate, regular rhythm and normal heart sounds.   No murmur heard. Pulmonary/Chest: Effort normal and breath sounds normal. She has no wheezes.  Abdominal: She exhibits no distension and no mass.  Musculoskeletal: She exhibits no edema.  Lymphadenopathy:    She has no cervical adenopathy.  Neurological: She is alert and oriented to person, place, and time.  Skin: Skin is warm and dry. No rash noted. She is not diaphoretic.  Psychiatric: Memory, affect and judgment normal.    Lab Results  Component Value Date   TSH 3.26 06/10/2014   Lab Results  Component Value Date   WBC 11.0* 06/10/2014   HGB 12.8 06/10/2014   HCT 38.0 06/10/2014   MCV 88.7 06/10/2014   PLT 324.0 06/10/2014   Lab Results  Component Value Date   CREATININE 0.9 06/10/2014   BUN 24* 06/10/2014   NA 138 06/10/2014   K 5.1 06/10/2014   CL 101 06/10/2014   CO2 28 06/10/2014   Lab Results  Component Value Date   ALT 15 06/10/2014   AST 16 06/10/2014   ALKPHOS 112 06/10/2014   BILITOT 0.4 06/10/2014   Lab Results   Component Value Date   CHOL 270* 10/13/2014   Lab Results  Component Value Date   HDL 32.20* 10/13/2014   Lab Results  Component Value Date   LDLCALC 151* 02/20/2014   Lab Results  Component Value Date   TRIG 404.0* 10/13/2014   Lab Results  Component Value Date   CHOLHDL 8 10/13/2014     Assessment & Plan  Hyperlipidemia Encouraged heart healthy diet, increase exercise, avoid trans fats, consider a krill oil cap daily   Diabetes type 2, uncontrolled hgba1c unacceptable minimize simple carbs. Increase exercise as tolerated. Continue current meds but increase by 2  units every 3 days til numbers   Diabetic foot infection Continues to follow with surgery, is following closely with surgeon and is slowly improvingbut has had numerous surgeries/toe amputations.   Thyroid disease On Levothyroxine, continue to monitor

## 2015-04-26 NOTE — Assessment & Plan Note (Addendum)
Continues to follow with surgery, is following closely with surgeon and is slowly improvingbut has had numerous surgeries/toe amputations.

## 2015-06-16 ENCOUNTER — Other Ambulatory Visit: Payer: Self-pay | Admitting: Family Medicine

## 2015-06-16 NOTE — Telephone Encounter (Signed)
Pt called for refill on hydrocodone. She is out of meds. Takes 2-3/day when she has it. Please call when RX is ready for pick up.

## 2015-06-17 NOTE — Telephone Encounter (Signed)
OK to refill Hydrocodone same strength, same sig, #120

## 2015-06-17 NOTE — Telephone Encounter (Signed)
Last filled 04/13/15 Contract 10/21/14   HYDROcodone-acetaminophen (NORCO) 10-325 MG per tablet Every 6 hours for pain   Please advise.

## 2015-06-18 NOTE — Telephone Encounter (Signed)
Pt calling to f/u on RX for Hydrocodone so she doesn't run out this weekend.

## 2015-06-19 ENCOUNTER — Other Ambulatory Visit: Payer: Self-pay

## 2015-06-19 DIAGNOSIS — S98111A Complete traumatic amputation of right great toe, initial encounter: Secondary | ICD-10-CM

## 2015-06-19 MED ORDER — HYDROCODONE-ACETAMINOPHEN 10-325 MG PO TABS: 1.0000 | ORAL_TABLET | Freq: Four times a day (QID) | ORAL | Status: DC | PRN

## 2015-06-19 NOTE — Telephone Encounter (Signed)
Relation to pt: self  Call back number: [phone]   Reason for call:  Patient calling checking on the status of medication request. Patient states she is completely out.

## 2015-06-19 NOTE — Telephone Encounter (Signed)
Pt called again upset that she keeps calling and not getting her RX. Please f/u asap. [phone]

## 2015-06-19 NOTE — Telephone Encounter (Signed)
Printed, Dr Drue Novel signed in Dr Mariel Aloe absence.  Patient advised. Ok per patient for son to pick, Janene Harvey.

## 2015-07-14 NOTE — Telephone Encounter (Signed)
Pt states that disability office told her today that they are waiting on 2nd request for information from Dr. Abner Greenspan since 06/11/15. Her disability/social security claim cannot be determined until they receive the information. Phone # for disability office is [phone].

## 2015-07-15 NOTE — Telephone Encounter (Signed)
They will have to contact medical records. As I noted below, we send them the requests. JG//CMA

## 2015-07-20 NOTE — Telephone Encounter (Signed)
Informed Latoya from [employer] [employer] of the below message advising a follow up with medical records. Provided phone number. She expressed understanding and says that she will follow up with them instead.

## 2015-07-21 ENCOUNTER — Other Ambulatory Visit: Payer: Self-pay | Admitting: Family Medicine

## 2015-08-03 IMAGING — CR DG FOOT COMPLETE 3+V*R*
3 series · 3 of 3 positions shown · non-contrast
Comparison: 12/18/2013

CLINICAL DATA: Pain and swelling

EXAM:
RIGHT FOOT COMPLETE - 3+ VIEW

[t foot ap right]
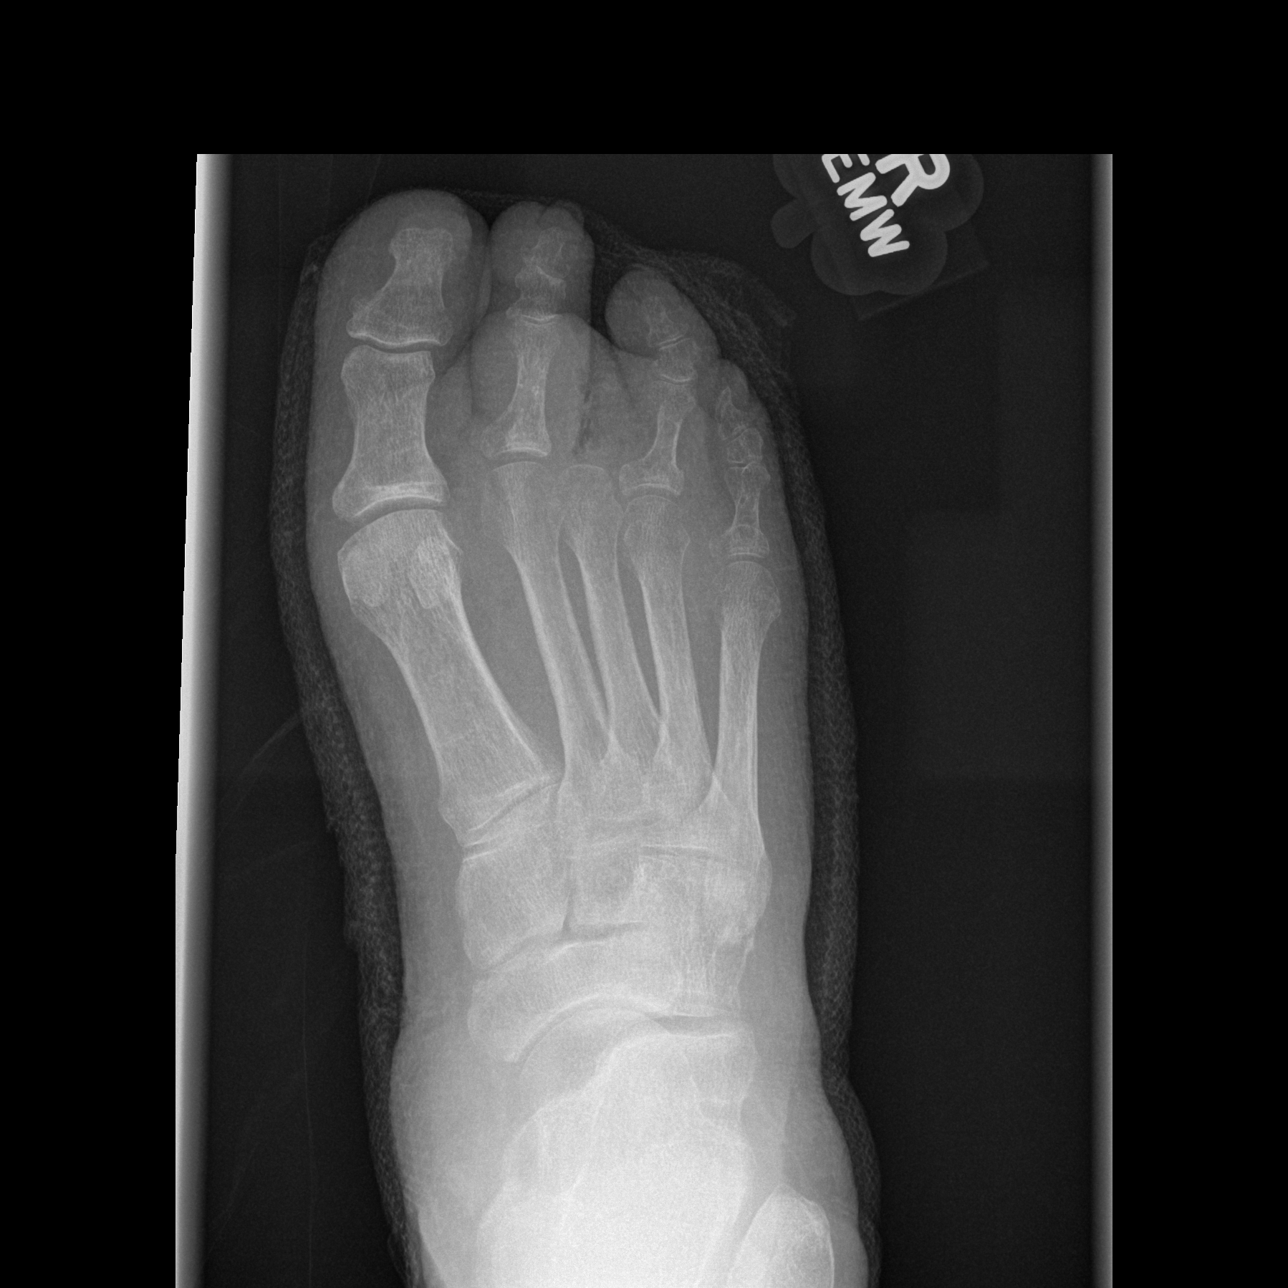

[t foot lat right]
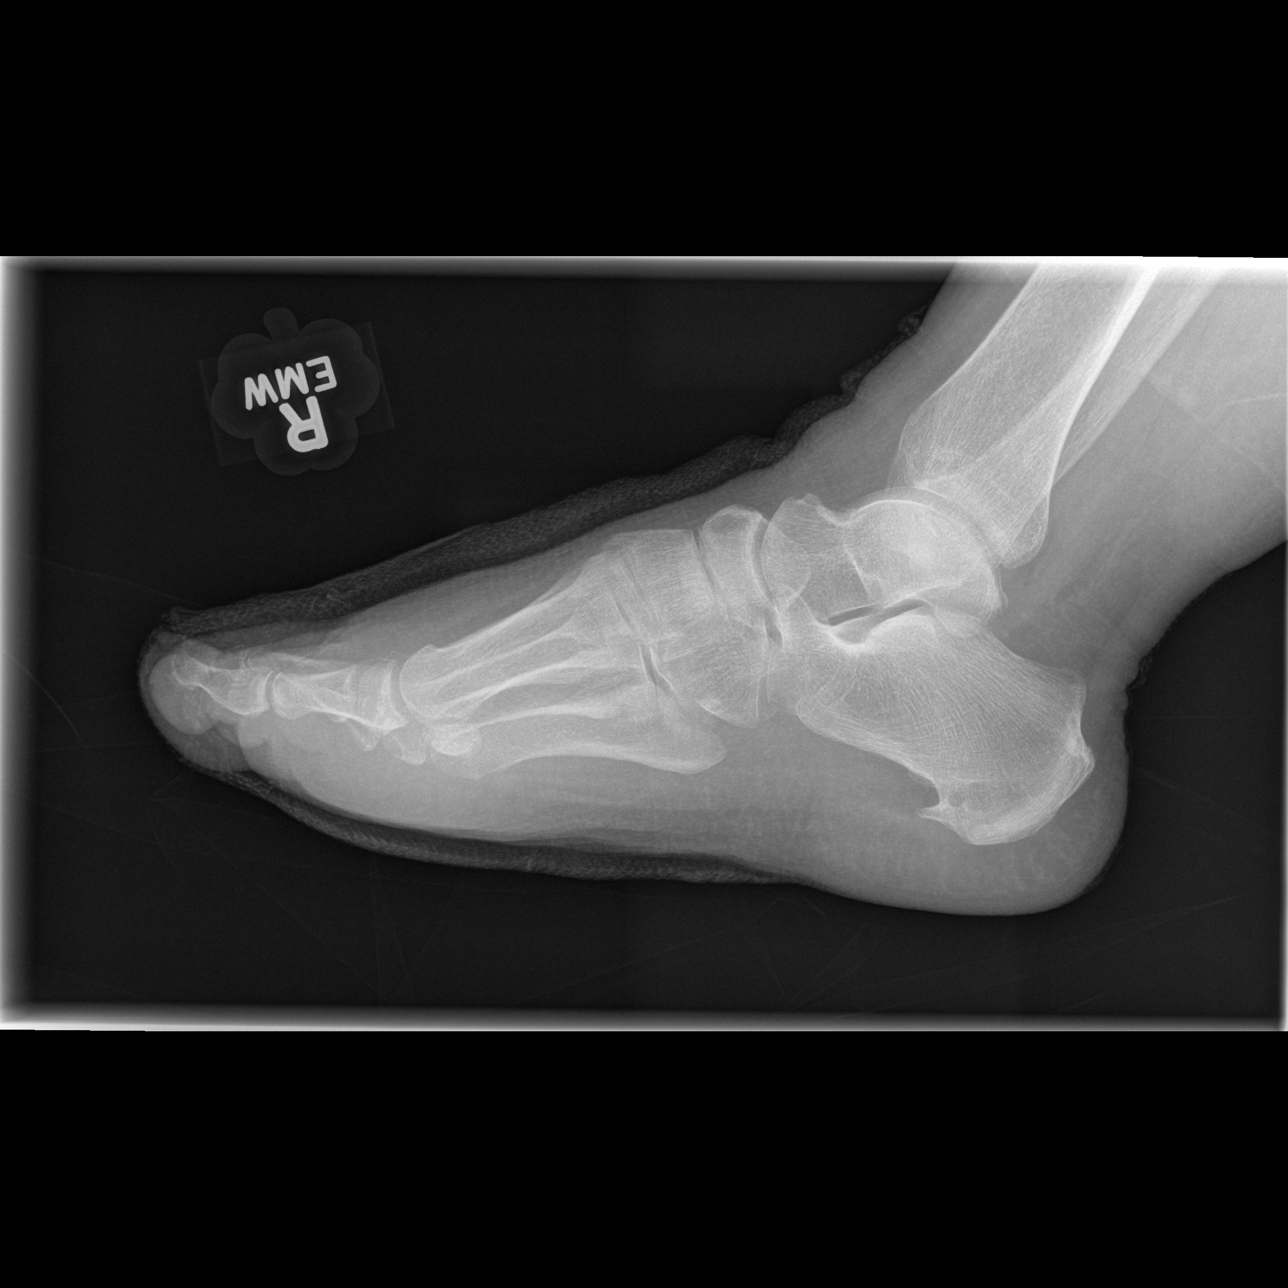

[t foot oblique right]
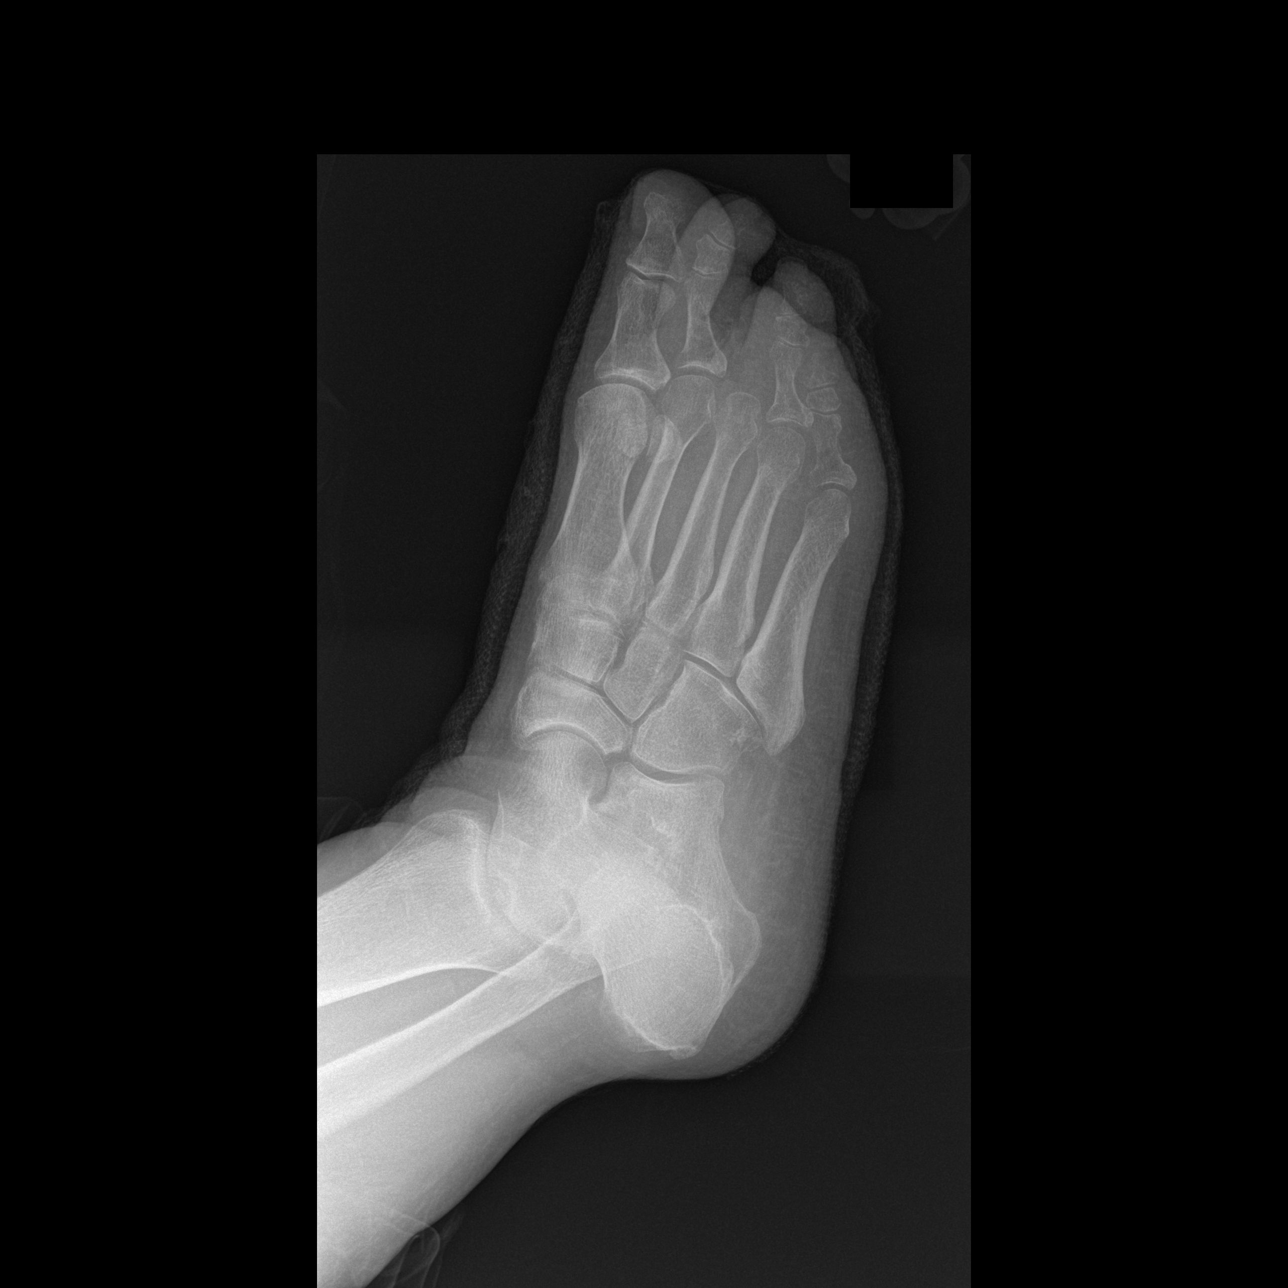

[3 of 3 positions shown; findings below may reference images not displayed]

FINDINGS: There are changes consistent with prior amputation of the third
digit. Some air is noted in the surgical bed which may be related to
underlying infection. No definitive osteomyelitis is seen.
IMPRESSION: Postoperative change. Air is noted in the soft tissues of the
operative site. This may be related underlying infection. No
definitive osteomyelitis is seen.

## 2015-08-19 ENCOUNTER — Other Ambulatory Visit: Payer: Self-pay | Admitting: Family Medicine

## 2015-08-19 DIAGNOSIS — S98111A Complete traumatic amputation of right great toe, initial encounter: Secondary | ICD-10-CM

## 2015-08-19 NOTE — Telephone Encounter (Signed)
Caller name: Self   Can be reached: [phone]  [facility]:  [facility] [address] [place], [place] - [address], [address] [phone] (Phone) [phone])         Reason for call: Patient request refill on HYDROcodone-acetaminophen (NORCO) 10-325 MG per tablet [[id number]][[id number]] Can reduce to 90 pills instead of 120 pills

## 2015-08-20 NOTE — Telephone Encounter (Signed)
OK to refill I think needs UDS? If has not done one

## 2015-08-20 NOTE — Telephone Encounter (Signed)
Requesting:  hydrocodone Contract  Signed on 10/20/14 UDS  Low risk Last OV    04/13/15 Last Refill    #120 with 0 refills on 06/19/15  Please Advise

## 2015-08-20 NOTE — Telephone Encounter (Signed)
Pt calling to check on status of RX approval. She is not able to drive and doesn't want to rush someone else to come pick up. Please call when ready at [phone]

## 2015-08-21 MED ORDER — HYDROCODONE-ACETAMINOPHEN 10-325 MG PO TABS: 1.0000 | ORAL_TABLET | Freq: Four times a day (QID) | ORAL | Status: DC | PRN

## 2015-08-21 NOTE — Telephone Encounter (Signed)
Printed and on counter for signature. 

## 2015-08-21 NOTE — Telephone Encounter (Signed)
Patient informed to pickup hardcopy, but must do a UDS then can get hardcopy.

## 2015-10-06 ENCOUNTER — Telehealth: Payer: Self-pay | Admitting: Family Medicine

## 2015-10-06 DIAGNOSIS — S98111A Complete traumatic amputation of right great toe, initial encounter: Secondary | ICD-10-CM

## 2015-10-06 NOTE — Telephone Encounter (Signed)
Caller name: Self   Can be reached: [phone]  [facility]  [facility] [address] - [place], [place] - [address], [address] [phone] (Phone) [phone] (Fax)         Reason for call: Request refill on  HYDROcodone-acetaminophen (NORCO) 10-325 MG tablet [[id number]][[id number]]

## 2015-10-06 NOTE — Telephone Encounter (Signed)
Caller name:Denna  Relationship to patient:[facility] Can be reached:[phone] Pharmacy:  Reason for call:Patient blood sugar is elevated.  Patient cannot use her glucometer.  Is there anything that can be done to get her a talking glucometer

## 2015-10-06 NOTE — Telephone Encounter (Signed)
Okay hydrocodone #120, no refill. She is asking for a talking glucometer, I'm not familiar with the patient, if she is blind or have difficulty with her vision definitely needs a talking glucometer. Please arrange

## 2015-10-06 NOTE — Telephone Encounter (Signed)
Pt called back to verify for her prescription, states has not heard from anyone for her rx. Pt states is in a lot of pain came out of the hospital and has no meds rx (finished her last meds this morning) Pt needs meds ASAP.

## 2015-10-07 MED ORDER — HYDROCODONE-ACETAMINOPHEN 10-325 MG PO TABS: 1.0000 | ORAL_TABLET | Freq: Four times a day (QID) | ORAL | Status: DC | PRN

## 2015-10-07 NOTE — Telephone Encounter (Signed)
Printed and on counter for signature. Patient notified to pickup hardcopy at the front desk at her convenience. At this time the patient reported she is legally blind.  [facility]na who had requested help with a talking meter.  She stated for now they would be seeing the patient two times per week and would be checking her BS.  Patient is currently waiting on her medicaid to be competed, at that [facility].

## 2015-10-07 NOTE — Telephone Encounter (Signed)
Agree to talking meter, will sign, thanks

## 2015-10-08 NOTE — Telephone Encounter (Signed)
[facility] will notify once her medicaid is complete and can then order her meter

## 2015-10-15 ENCOUNTER — Ambulatory Visit: Payer: Self-pay | Admitting: Family Medicine

## 2015-10-16 ENCOUNTER — Ambulatory Visit: Payer: Self-pay | Admitting: Physician Assistant

## 2015-10-16 ENCOUNTER — Telehealth: Payer: Self-pay | Admitting: Family Medicine

## 2015-10-16 ENCOUNTER — Telehealth: Payer: Self-pay | Admitting: Physician Assistant

## 2015-10-16 NOTE — Telephone Encounter (Signed)
Caller name: Deanna  Relationship to patient: [facility]  Can be reached: [phone]   Reason for call: Deanna from [facility] called to speak with CMA. No answer. She stated that she was with the patient and two toes on her infected foot (right foot) has turned completely black. She wanted to bring her in today so an appt was scheduled with Malva Coganody Martin @ 1:15 today. Informed Deanna that I would still send a message to inform Dr. Abner GreenspanBlyth of the situation and the appt. Please call Deanna at above number if there is something different she needs to do.

## 2015-10-16 NOTE — Telephone Encounter (Signed)
She needs to see us or go to ED today

## 2015-10-16 NOTE — Telephone Encounter (Signed)
HHRN informed of PCP instructions. 

## 2015-10-20 NOTE — Telephone Encounter (Signed)
Patient went to ED per daughter. No charge.

## 2015-10-20 NOTE — Telephone Encounter (Signed)
Pt was no show 10/16/15 acute appt, based on other tel note I think for infected toes, pt was advised to come here or go to ED, no reschedule, charge or no charge?

## 2015-10-28 ENCOUNTER — Ambulatory Visit: Payer: Self-pay | Admitting: Medical

## 2015-11-12 ENCOUNTER — Ambulatory Visit: Payer: Self-pay | Admitting: Medical

## 2015-12-15 ENCOUNTER — Ambulatory Visit: Payer: Self-pay | Admitting: Family Medicine

## 2015-12-21 ENCOUNTER — Other Ambulatory Visit: Payer: Self-pay | Admitting: Family Medicine

## 2015-12-21 DIAGNOSIS — S98111A Complete traumatic amputation of right great toe, initial encounter: Secondary | ICD-10-CM

## 2015-12-21 MED ORDER — BACLOFEN 10 MG PO TABS: 10.0000 mg | ORAL_TABLET | Freq: Four times a day (QID) | ORAL | Status: DC | PRN

## 2015-12-21 MED ORDER — HYDROCODONE-ACETAMINOPHEN 10-325 MG PO TABS: 1.0000 | ORAL_TABLET | Freq: Four times a day (QID) | ORAL | Status: DC | PRN

## 2015-12-21 NOTE — Addendum Note (Signed)
Addended by: Scharlene GlossEWING, ROBIN B on: 12/21/2015 01:18 PM   Modules accepted: Orders, Medications

## 2015-12-21 NOTE — Telephone Encounter (Signed)
She can have refill on both

## 2015-12-21 NOTE — Telephone Encounter (Signed)
Caller name:Deanna Relationship to patient:[facility] Can be [phone] [facility] [place]  Reason for call:Requesting refill on Baclofen and hydrocodone

## 2015-12-21 NOTE — Telephone Encounter (Signed)
Requesting: Hydrocodone Contract  10/20/2014 UDS  Low risk Last OV   04/13/2015 Last Refill   #120 with 0 refills on 10/07/2015  Please Advise----Patient Also Requesting Baclofen, but I do not see on her list.

## 2015-12-21 NOTE — Telephone Encounter (Signed)
Called the patient informed to pickup hardcopy for Hydrocodone at the front desk, and did add baclofen to her list and sent in

## 2015-12-25 ENCOUNTER — Telehealth: Payer: Self-pay | Admitting: *Deleted

## 2015-12-25 NOTE — Telephone Encounter (Signed)
Patient is scheduled for Mon, 12/28/15 for Med/Sx Clearance w/EKG with Esperanza RichtersEdward Saguier, CMA aware and has paperwork/SLS 03/24

## 2015-12-28 ENCOUNTER — Ambulatory Visit: Payer: Self-pay | Admitting: Medical

## 2015-12-29 ENCOUNTER — Ambulatory Visit (INDEPENDENT_AMBULATORY_CARE_PROVIDER_SITE_OTHER): Payer: Medicaid Other | Admitting: Medical

## 2015-12-29 ENCOUNTER — Encounter: Payer: Self-pay | Admitting: Medical

## 2015-12-29 VITALS — BP 98/60 | HR 84 | Temp 98.1°F | Ht 62.0 in | Wt 169.0 lb

## 2015-12-29 DIAGNOSIS — E1159 Type 2 diabetes mellitus with other circulatory complications: Secondary | ICD-10-CM

## 2015-12-29 DIAGNOSIS — Z01818 Encounter for other preprocedural examination: Secondary | ICD-10-CM

## 2015-12-29 DIAGNOSIS — E785 Hyperlipidemia, unspecified: Secondary | ICD-10-CM | POA: Diagnosis not present

## 2015-12-29 DIAGNOSIS — IMO0002 Reserved for concepts with insufficient information to code with codable children: Secondary | ICD-10-CM

## 2015-12-29 DIAGNOSIS — I739 Peripheral vascular disease, unspecified: Secondary | ICD-10-CM

## 2015-12-29 DIAGNOSIS — E1165 Type 2 diabetes mellitus with hyperglycemia: Secondary | ICD-10-CM

## 2015-12-29 LAB — CBC WITH DIFFERENTIAL/PLATELET
BASOS PCT: 0.5 % (ref 0.0–3.0)
Basophils Absolute: 0.1 10*3/uL (ref 0.0–0.1)
EOS PCT: 5 % (ref 0.0–5.0)
Eosinophils Absolute: 0.6 10*3/uL (ref 0.0–0.7)
HEMATOCRIT: 36.5 % (ref 36.0–46.0)
HEMOGLOBIN: 12.2 g/dL (ref 12.0–15.0)
Lymphocytes Relative: 18.9 % (ref 12.0–46.0)
Lymphs Abs: 2.2 10*3/uL (ref 0.7–4.0)
MCHC: 33.4 g/dL (ref 30.0–36.0)
MCV: 81.2 fl (ref 78.0–100.0)
MONOS PCT: 5.9 % (ref 3.0–12.0)
Monocytes Absolute: 0.7 10*3/uL (ref 0.1–1.0)
Neutro Abs: 8 10*3/uL — ABNORMAL HIGH (ref 1.4–7.7)
Neutrophils Relative %: 69.7 % (ref 43.0–77.0)
Platelets: 471 10*3/uL — ABNORMAL HIGH (ref 150.0–400.0)
RBC: 4.49 Mil/uL (ref 3.87–5.11)
RDW: 14.9 % (ref 11.5–15.5)
WBC: 11.4 10*3/uL — AB (ref 4.0–10.5)

## 2015-12-29 LAB — COMPREHENSIVE METABOLIC PANEL
ALBUMIN: 4.2 g/dL (ref 3.5–5.2)
ALT: 13 U/L (ref 0–35)
AST: 12 U/L (ref 0–37)
Alkaline Phosphatase: 144 U/L — ABNORMAL HIGH (ref 39–117)
BILIRUBIN TOTAL: 0.3 mg/dL (ref 0.2–1.2)
BUN: 29 mg/dL — ABNORMAL HIGH (ref 6–23)
CALCIUM: 10 mg/dL (ref 8.4–10.5)
CO2: 23 mEq/L (ref 19–32)
CREATININE: 0.97 mg/dL (ref 0.40–1.20)
Chloride: 96 mEq/L (ref 96–112)
GFR: 64.1 mL/min (ref >60.00)
GLUCOSE: 345 mg/dL — AB (ref 70–99)
POTASSIUM: 4.6 meq/L (ref 3.5–5.1)
Sodium: 130 mEq/L — ABNORMAL LOW (ref 135–145)
TOTAL PROTEIN: 7.7 g/dL (ref 6.0–8.3)

## 2015-12-29 LAB — LIPID PANEL
Cholesterol: 249 mg/dL — ABNORMAL HIGH (ref 0–200)
HDL: 36.5 mg/dL — AB (ref >39.00)
NONHDL: 212.24
Total CHOL/HDL Ratio: 7
Triglycerides: 393 mg/dL — ABNORMAL HIGH (ref 0.0–149.0)
VLDL: 78.6 mg/dL — ABNORMAL HIGH (ref 0.0–40.0)

## 2015-12-29 LAB — LDL CHOLESTEROL, DIRECT: LDL DIRECT: 136 mg/dL

## 2015-12-29 NOTE — Progress Notes (Signed)
Subjective:    Patient ID: Dawn Cabrera, female    DOB: 1965-10-09, 50 y.o.   MRN: [id number]  HPI  Pt in for evaluation. Pt had spot on left heal area ulcerated area. On close inspection she has  wound vac over area.  Also on her rt foot  she surgery toes amputated.Last surgery to rt foot was January of this year.  Pt states rt foot surgery most recenlty was done emergently.  So pt is scheduled to have catarct surgery on her left eye tomorrow. Pt will be under conscious sedation.  Then on Monday will get injection to her rt eye.She had cataract removed in past to rt eye.  Pt also will have her teeth pulled Monday afternoon. She has plans to remove 3 teeth.   Pt never had problems with general anesthesia or conscious sedation. Pt had general anesthesia about 10 times. No complications.   Pt has no recentor past history of popliteal pain. No sob, no wheezing, no coagulation disorders. No family hx of early sudden death or anesthesia reaction. Pt used to smoke but stopped  Smoking December 18, 2013. Pt is diabetic Her last a1-c was 12.2.  Pt is seeing other MD endocrinologist. She states sugars have between 83-160 recently.  Pt in on levimir 40 units at night. Humalog sliding scale. Using less sliding scale now.  Pt has no sob or wheezing.    Review of Systems  Constitutional: Negative for fever, chills and fatigue.  Respiratory: Negative for cough, choking, shortness of breath and wheezing.   Cardiovascular: Negative for chest pain and palpitations.  Gastrointestinal: Negative for vomiting, abdominal pain, diarrhea and constipation.  Musculoskeletal:       See hpi on her feet.  Skin: Negative for rash.  Neurological: Negative for dizziness and headaches.  Hematological: Negative for adenopathy. Does not bruise/bleed easily.  Psychiatric/Behavioral: Negative for behavioral problems and confusion.    Past Medical History  Diagnosis Date  . Hyperlipidemia   . Thyroid disease   .  Ulcer   . Diabetes mellitus without complication (HCC) 10 years    type 2   . Chicken pox as child  . Polycystic ovarian syndrome 99  . DDD (degenerative disc disease)     low back pain  . Diabetes (HCC) 10/13/2013  . PVD (peripheral vascular disease) (HCC) 01/01/2014    Right leg S/p 3 stents in right leg at [facility] in March of 2015  . S/P amputation of lesser toe (HCC) 01/01/2014  . Other and unspecified hyperlipidemia 06/15/2014  . Preventative health care 10/26/2014  . UTI (urinary tract infection) 10/26/2014  . Status post amputation of right great toe (HCC) 04/13/2015    Social History   Social History  . Marital Status: Divorced    Spouse Name: N/A  . Number of Children: N/A  . Years of Education: N/A   Occupational History  . Not on file.   Social History Main Topics  . Smoking status: Former Smoker -- 0.50 packs/day for 34 years    Types: Cigarettes    Quit date: 12/18/2013  . Smokeless tobacco: Never Used  . Alcohol Use: No  . Drug Use: No  . Sexual Activity:    Partners: Male   Other Topics Concern  . Not on file   Social History Narrative    Past Surgical History  Procedure Laterality Date  . Abdominal hysterectomy  05-28-98    total  . Cesarean section  10-10-85  . Abdominal exploration  surgery  1999    before hysterectomy  . Toe amputation Right 12/18/13  . Vascular surgery  08-05-14,09-03-14  . Eye surgery  10-15-14    Family History  Problem Relation Age of Onset  . COPD Mother 71  . Diabetes Mother     type 2  . Alcohol abuse Mother   . Thyroid disease Daughter   . Cancer Maternal Grandmother     unsure  . Alcohol abuse Maternal Grandmother   . Alcohol abuse Father   . Alcohol abuse Brother   . Alcohol abuse Maternal Grandfather   . Alcohol abuse Brother   . Alcohol abuse Brother     Allergies  Allergen Reactions  . Livalo [Pitavastatin] Other (See Comments)    Body aches    Current Outpatient Prescriptions on File Prior to Visit    Medication Sig Dispense Refill  . aspirin 81 MG tablet Take 81 mg by mouth daily.    . baclofen (LIORESAL) 10 MG tablet Take 1 tablet (10 mg total) by mouth every 6 (six) hours as needed for muscle spasms. 30 each 2  . clopidogrel (PLAVIX) 75 MG tablet Take 1 tablet (75 mg total) by mouth once. 90 tablet 1  . doxycycline (VIBRAMYCIN) 100 MG capsule Take 100 mg by mouth 2 (two) times daily. Take for 2 weeks    . fluconazole (DIFLUCAN) 100 MG tablet Take 100 mg by mouth once.    Marland Kitchen HYDROcodone-acetaminophen (NORCO) 10-325 MG tablet Take 1 tablet by mouth every 6 (six) hours as needed for moderate pain (foot, knee, back pain). 120 tablet 0  . insulin aspart (NOVOLOG) 100 UNIT/ML injection Pt states she takes as needed per sliding scale.    Marland Kitchen LEVEMIR FLEXTOUCH 100 UNIT/ML Pen INFECT 50 UNITS INTO THE SKIN DAILY AT 10 PM (Patient taking differently: INFECT 54 UNITS INTO THE SKIN DAILY AT 10 PM) 15 mL 2  . levothyroxine (SYNTHROID, LEVOTHROID) 200 MCG tablet TAKE ONE TABLET BY MOUTH ONCE DAILY BEFORE BREAKFAST 30 tablet 0  . metformin (FORTAMET) 1000 MG (OSM) 24 hr tablet TAKE ONE TABLET BY MOUTH TWICE DAILY WITH  A  MEAL 60 tablet 11  . ONGLYZA 5 MG TABS tablet TAKE ONE TABLET BY MOUTH ONCE DAILY 30 tablet 2  . sertraline (ZOLOFT) 50 MG tablet TAKE ONE TABLET BY MOUTH ONCE DAILY 30 tablet 2  . Vitamin D, Cholecalciferol, 1000 UNITS CAPS Take 2,000 mg by mouth daily.     No current facility-administered medications on file prior to visit.    BP 98/60 mmHg  Pulse 84  Temp(Src) 98.1 F (36.7 C) (Oral)  Ht  (1.575 m)  Wt 169 lb (76.658 kg)  BMI 30.90 kg/m2  SpO2 98%  LMP 05/28/1998       Objective:   Physical Exam  General Mental Status- Alert. General Appearance- Not in acute distress.   Skin General: Color- Normal Color. Moisture- Normal Moisture.  Neck Carotid Arteries- Normal color. Moisture- Normal Moisture. No carotid bruits. No JVD.  Chest and Lung  Exam Auscultation: Breath Sounds:-Normal.  Cardiovascular Auscultation:Rythm- Regular. Murmurs & Other Heart Sounds:Auscultation of the heart reveals- No Murmurs.  Abdomen Inspection:-Inspeection Normal. Palpation/Percussion:Note:No mass. Palpation and Percussion of the abdomen reveal- Non Tender, Non Distended + BS, no rebound or guarding.    Neurologic Cranial Nerve exam:- CN III-XII intact(No nystagmus), symmetric smile. tStrength:- 5/5 equal and symmetric strength both upper and lower extremities.  Lower ext- no pedal edema. Feet are bandaged and wound vac attached  to both feet.      Assessment & Plan:    Pt v2 shows some t wave flattening. Otherwise looks same. No significant change. Pt is aymptomatic. No cardiac symptoms. Dr. Abner GreenspanBlyth and myself filled out form.  For your pre-op labs will get cbc and cmp.  I have reviewed your ekg today and prior ekg with Dr. Abner GreenspanBlyth today.  We filled out forms today.   Follow up 3-4 weeks or as needed  Asked MA to get ekg and make copy and copy PE form.

## 2015-12-29 NOTE — Patient Instructions (Addendum)
For your pre-op labs will get cbc and cmp.  I have reviewed your ekg today and prior ekg with Dr. Abner GreenspanBlyth today.  We filled out forms today.   Follow up 3-4 weeks or as needed  Please sign release form so we can get records from new endocrinologist.

## 2015-12-29 NOTE — Progress Notes (Signed)
Pre visit review using our clinic review tool, if applicable. No additional management support is needed unless otherwise documented below in the visit note. 

## 2015-12-30 ENCOUNTER — Telehealth: Payer: Self-pay | Admitting: Medical

## 2015-12-30 NOTE — Telephone Encounter (Signed)
Dr. Abner GreenspanBlyth This is pt I reviewed with you for preop cataract surgery. . I don't know how to forward note to you. Sorry. Her labs were moderately poor consistent with her history. I thought nothing on labs that should stop her surgery although far from perfect labs.  Thanks, American FinancialEdward Meliyah Simon PA-C

## 2015-12-30 NOTE — Telephone Encounter (Signed)
Thanks, I agree

## 2015-12-31 ENCOUNTER — Encounter: Payer: Self-pay | Admitting: Family Medicine

## 2016-02-11 ENCOUNTER — Other Ambulatory Visit: Payer: Self-pay | Admitting: Family Medicine

## 2016-02-11 DIAGNOSIS — S98111A Complete traumatic amputation of right great toe, initial encounter: Secondary | ICD-10-CM

## 2016-02-11 MED ORDER — GABAPENTIN 100 MG PO CAPS: 100.0000 mg | ORAL_CAPSULE | Freq: Three times a day (TID) | ORAL | Status: DC

## 2016-02-11 MED ORDER — HYDROCODONE-ACETAMINOPHEN 10-325 MG PO TABS: 1.0000 | ORAL_TABLET | Freq: Four times a day (QID) | ORAL | Status: DC | PRN

## 2016-02-11 NOTE — Telephone Encounter (Signed)
Requesting: Hydrocodone Contract  10/20/2014 UDS low risk, now due Last OV  04/13/2015 Last Refill  #120 with 0 refills on 12/21/2015  Please Advise

## 2016-02-11 NOTE — Telephone Encounter (Signed)
Caller name: Rodell Pernaatrice Relationship to patient: self Can be reached: [phone] [facility]  Reason for call: Pt needing refill on gabapentin that was ordered in rehab. She is on 100mg  3/day. She is also needing refill on Hydrocodone. Takes 3-4 day and has 2 pills left. Please call when hydrocodone ready to be picked up. She states her daughter needs to pick up the RX as she is in a wheelchair and not able to get around well and waiting on new WC to be delivered today. Can her daughter pick up?

## 2016-02-11 NOTE — Telephone Encounter (Signed)
Glass blower/designerrinted hardcopy for hydrocodone and on counter for signature. Sent in Gabapentin to local pharmacy. Called the patient left a detailed message hardcopy is ready pickup.

## 2016-02-11 NOTE — Telephone Encounter (Signed)
i am willing to help with meds but I need to see her soon if she wants me to be responsible for these strong meds. OK to refill the Hydrocodone same strength, 1 tab po tid prn pain, #90 and Gabapentin 100 mg tabs 1 tab po tid disp #90 with 1 rf til seen

## 2016-03-01 ENCOUNTER — Ambulatory Visit: Payer: Medicaid Other | Admitting: Physician Assistant

## 2016-03-28 ENCOUNTER — Ambulatory Visit: Payer: Medicaid Other | Admitting: Family Medicine

## 2016-05-06 ENCOUNTER — Telehealth: Payer: Self-pay | Admitting: Family Medicine

## 2016-05-06 DIAGNOSIS — S98111A Complete traumatic amputation of right great toe, initial encounter: Secondary | ICD-10-CM

## 2016-05-06 NOTE — Telephone Encounter (Addendum)
Caller name: Deanna  Relation to pt: RN from [facility]  Call back [phone] Pharmacy:  [facility] [address], [place] - [address], [address] [phone] (Phone) [phone] (Fax)      Reason for call:  Patient requesting a refill gabapentin (NEURONTIN) 100 MG capsule, sertraline (ZOLOFT) 50 MG tablet and HYDROcodone-acetaminophen (NORCO) 10-325 MG tablet

## 2016-05-09 MED ORDER — GABAPENTIN 100 MG PO CAPS: 100.0000 mg | ORAL_CAPSULE | Freq: Three times a day (TID) | ORAL | 0 refills | Status: DC

## 2016-05-09 MED ORDER — HYDROCODONE-ACETAMINOPHEN 10-325 MG PO TABS: 1.0000 | ORAL_TABLET | Freq: Four times a day (QID) | ORAL | 0 refills | Status: DC | PRN

## 2016-05-09 MED ORDER — SERTRALINE HCL 50 MG PO TABS: 50.0000 mg | ORAL_TABLET | Freq: Every day | ORAL | 2 refills | Status: DC

## 2016-05-09 NOTE — Telephone Encounter (Signed)
Reviewed NCCSR- she got hydrocodone from Dr. Abner GreenspanBlyth, 23 day supply on 5/12.  Did receive oxycodone from another provider on 7/1- however this was a 20 day supply. Will refill rx for her, forward copy of NCCSR to Dr. Abner GreenspanBlyth

## 2016-05-09 NOTE — Telephone Encounter (Signed)
Pt called in to schedule medication F/U with PCP. Pt is unable to drive and can only come on certain days. Pt is scheduled for 8/28. Pt would like to know if provider could provide her with a Rx until her appt.

## 2016-05-09 NOTE — Telephone Encounter (Signed)
This patient has not been seen by PCP since July 2016.  Advise on this refill/or deny due to needing appt.

## 2016-05-09 NOTE — Telephone Encounter (Signed)
Requesting: Hydrocodone Contract  10/20/2014 UDS  Low risk, next due 5/17 Last OV   04/13/2015 Last Refill   #90 with 0 refills on 02/11/2016  Please Advise

## 2016-05-09 NOTE — Telephone Encounter (Signed)
LVM advising patient to schedule appointment  °

## 2016-05-09 NOTE — Addendum Note (Signed)
Addended by: Abbe AmsterdamOPLAND, Haitham Dolinsky C on: 05/09/2016 05:59 PM   Modules accepted: Orders

## 2016-05-09 NOTE — Telephone Encounter (Signed)
Advise on this refill for Sertraline, Gabapentin and hydrocodone.

## 2016-05-30 ENCOUNTER — Ambulatory Visit: Payer: Medicaid Other | Admitting: Family Medicine

## 2016-06-16 ENCOUNTER — Ambulatory Visit (INDEPENDENT_AMBULATORY_CARE_PROVIDER_SITE_OTHER): Payer: Self-pay | Admitting: Family Medicine

## 2016-06-16 ENCOUNTER — Encounter: Payer: Self-pay | Admitting: Family Medicine

## 2016-06-16 DIAGNOSIS — S98111A Complete traumatic amputation of right great toe, initial encounter: Secondary | ICD-10-CM

## 2016-06-16 DIAGNOSIS — K219 Gastro-esophageal reflux disease without esophagitis: Secondary | ICD-10-CM

## 2016-06-16 DIAGNOSIS — Z89411 Acquired absence of right great toe: Secondary | ICD-10-CM

## 2016-06-16 DIAGNOSIS — Z89512 Acquired absence of left leg below knee: Secondary | ICD-10-CM

## 2016-06-16 DIAGNOSIS — F418 Other specified anxiety disorders: Secondary | ICD-10-CM

## 2016-06-16 DIAGNOSIS — F329 Major depressive disorder, single episode, unspecified: Secondary | ICD-10-CM

## 2016-06-16 DIAGNOSIS — Z89421 Acquired absence of other right toe(s): Secondary | ICD-10-CM

## 2016-06-16 DIAGNOSIS — E079 Disorder of thyroid, unspecified: Secondary | ICD-10-CM

## 2016-06-16 DIAGNOSIS — E782 Mixed hyperlipidemia: Secondary | ICD-10-CM

## 2016-06-16 DIAGNOSIS — F419 Anxiety disorder, unspecified: Secondary | ICD-10-CM

## 2016-06-16 MED ORDER — GABAPENTIN 100 MG PO CAPS: 100.0000 mg | ORAL_CAPSULE | Freq: Three times a day (TID) | ORAL | 5 refills | Status: DC

## 2016-06-16 MED ORDER — HYDROCODONE-ACETAMINOPHEN 10-325 MG PO TABS: 1.0000 | ORAL_TABLET | Freq: Four times a day (QID) | ORAL | 0 refills | Status: DC | PRN

## 2016-06-16 MED ORDER — SERTRALINE HCL 50 MG PO TABS: 50.0000 mg | ORAL_TABLET | Freq: Every day | ORAL | 5 refills | Status: DC

## 2016-06-16 MED ORDER — BACLOFEN 10 MG PO TABS: 10.0000 mg | ORAL_TABLET | Freq: Three times a day (TID) | ORAL | 5 refills | Status: DC | PRN

## 2016-06-16 NOTE — Patient Instructions (Signed)

## 2016-06-16 NOTE — Progress Notes (Signed)
Pre visit review using our clinic review tool, if applicable. No additional management support is needed unless otherwise documented below in the visit note. 

## 2016-06-26 ENCOUNTER — Encounter: Payer: Self-pay | Admitting: Family Medicine

## 2016-06-26 NOTE — Assessment & Plan Note (Signed)
S/p on left. Is struggling with ongoing pain will continue pain meds for now and she is following with surgery still as well. Has healed well.

## 2016-06-26 NOTE — Assessment & Plan Note (Signed)
Try H2 blocker prn, probiotics daily, avoid offending foods.

## 2016-06-26 NOTE — Assessment & Plan Note (Signed)
Is struggling with pain and depression but is stable on Sertraline

## 2016-06-26 NOTE — Assessment & Plan Note (Signed)
Encouraged heart healthy diet, increase exercise, avoid trans fats, consider a krill oil cap daily. She agrees to labs and reevaluation at next appt.

## 2016-06-26 NOTE — Progress Notes (Signed)
Patient ID: Torina Ey, female   DOB: 01-05-1966, 50 y.o.   MRN: [id number]   Subjective:    Patient ID: Romeo Apple, female    DOB: 1965-12-08, 50 y.o.   MRN: [id number]  Chief Complaint  Patient presents with  . Follow-up    HPI Patient is in today for follow up. She has not been in for awhile secondary to being in and out of the hospital and ultimately having a L BKA. She is struggling with some phantom limb pain and persistent low back pain. No other recent illness. Denies CP/palp/SOB/HA/congestion/fevers or GU c/o. Taking meds as prescribed  Past Medical History:  Diagnosis Date  . Chicken pox as child  . DDD (degenerative disc disease)    low back pain  . Diabetes (HCC) 10/13/2013  . Diabetes mellitus without complication (HCC) 10 years   type 2   . Hyperlipidemia   . Other and unspecified hyperlipidemia 06/15/2014  . Polycystic ovarian syndrome 99  . Preventative health care 10/26/2014  . PVD (peripheral vascular disease) (HCC) 01/01/2014   Right leg S/p 3 stents in right leg at [facility] in March of 2015  . S/P amputation of lesser toe (HCC) 01/01/2014  . S/P BKA (below knee amputation) unilateral (HCC) 04/13/2015   Dr Lucianne Lei orthopaedic surgeon follows closely with patient every 2 weeks.    . 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 Thyroid disease   . Ulcer   . UTI (urinary tract infection) 10/26/2014    Past Surgical History:  Procedure Laterality Date  . ABDOMINAL EXPLORATION SURGERY  1999   before hysterectomy  . ABDOMINAL HYSTERECTOMY  05-28-98   total  . CESAREAN SECTION  10-10-85  . EYE SURGERY  10-15-14  . TOE AMPUTATION Right 12/18/13  . VASCULAR SURGERY  08-05-14,09-03-14    Family History  Problem Relation Age of Onset  . COPD Mother 40  . Diabetes Mother     type 2  . Alcohol abuse Mother   . Thyroid disease Daughter   . Cancer Maternal Grandmother     unsure  . Alcohol abuse Maternal Grandmother   . Alcohol abuse Father   .  Alcohol abuse Brother   . Alcohol abuse Maternal Grandfather   . Alcohol abuse Brother   . Alcohol abuse Brother     Social History   Social History  . Marital status: Divorced    Spouse name: N/A  . Number of children: N/A  . Years of education: N/A   Occupational History  . Not on file.   Social History Main Topics  . Smoking status: Former Smoker    Packs/day: 0.50    Years: 34.00    Types: Cigarettes    Quit date: 12/18/2013  . Smokeless tobacco: Never Used  . Alcohol use No  . Drug use: No  . Sexual activity: Yes    Partners: Male   Other Topics Concern  . Not on file   Social History Narrative  . No narrative on file    Outpatient Medications Prior to Visit  Medication Sig Dispense Refill  . clopidogrel (PLAVIX) 75 MG tablet Take 1 tablet (75 mg total) by mouth once. 90 tablet 1  . insulin aspart (NOVOLOG) 100 UNIT/ML injection Pt states she takes as needed per sliding scale.    Marland Kitchen LEVEMIR FLEXTOUCH 100 UNIT/ML Pen INFECT 50 UNITS INTO THE SKIN DAILY AT 10 PM (Patient taking differently: INFECT 54 UNITS INTO THE SKIN DAILY AT  10 PM) 15 mL 2  . levothyroxine (SYNTHROID, LEVOTHROID) 200 MCG tablet TAKE ONE TABLET BY MOUTH ONCE DAILY BEFORE BREAKFAST 30 tablet 0  . aspirin 81 MG tablet Take 81 mg by mouth daily.    . baclofen (LIORESAL) 10 MG tablet Take 1 tablet (10 mg total) by mouth every 6 (six) hours as needed for muscle spasms. 30 each 2  . doxycycline (VIBRAMYCIN) 100 MG capsule Take 100 mg by mouth 2 (two) times daily. Take for 2 weeks    . fluconazole (DIFLUCAN) 100 MG tablet Take 100 mg by mouth once.    . gabapentin (NEURONTIN) 100 MG capsule Take 1 capsule (100 mg total) by mouth 3 (three) times daily. 90 capsule 0  . HYDROcodone-acetaminophen (NORCO) 10-325 MG tablet Take 1 tablet by mouth every 6 (six) hours as needed for moderate pain (foot, knee, back pain). 90 tablet 0  . metformin (FORTAMET) 1000 MG (OSM) 24 hr tablet TAKE ONE TABLET BY MOUTH TWICE  DAILY WITH  A  MEAL 60 tablet 11  . ONGLYZA 5 MG TABS tablet TAKE ONE TABLET BY MOUTH ONCE DAILY 30 tablet 2  . sertraline (ZOLOFT) 50 MG tablet Take 1 tablet (50 mg total) by mouth daily. 30 tablet 2  . Vitamin D, Cholecalciferol, 1000 UNITS CAPS Take 2,000 mg by mouth daily.     No facility-administered medications prior to visit.     Allergies  Allergen Reactions  . Fenofibrate Anaphylaxis    myalgia  . Livalo [Pitavastatin] Other (See Comments)    Body aches    Review of Systems  Constitutional: Negative for fever and malaise/fatigue.  HENT: Negative for congestion.   Eyes: Negative for blurred vision.  Respiratory: Negative for shortness of breath.   Cardiovascular: Negative for chest pain, palpitations and leg swelling.  Gastrointestinal: Positive for heartburn. Negative for abdominal pain, blood in stool and nausea.  Genitourinary: Negative for dysuria and frequency.  Musculoskeletal: Positive for back pain, joint pain and myalgias. Negative for falls.  Skin: Negative for rash.  Neurological: Negative for dizziness, loss of consciousness and headaches.  Endo/Heme/Allergies: Negative for environmental allergies.  Psychiatric/Behavioral: Positive for depression. The patient is not nervous/anxious.        Objective:    Physical Exam  Constitutional: She is oriented to person, place, and time. She appears well-developed and well-nourished. No distress.  HENT:  Head: Normocephalic and atraumatic.  Nose: Nose normal.  Eyes: Right eye exhibits no discharge. Left eye exhibits no discharge.  Neck: Normal range of motion. Neck supple.  Cardiovascular: Normal rate and regular rhythm.   No murmur heard. Pulmonary/Chest: Effort normal and breath sounds normal.  Abdominal: Soft. Bowel sounds are normal. There is no tenderness.  Musculoskeletal: She exhibits no edema.  L BKA  Neurological: She is alert and oriented to person, place, and time.  Skin: Skin is warm and dry.    Psychiatric: She has a normal mood and affect.  Nursing note and vitals reviewed.   BP (!) 142/80 (BP Location: Left Arm, Patient Position: Sitting, Cuff Size: Normal)   Pulse 83   Temp 98.1 F (36.7 C) (Oral)   Ht 5\' 2"  (1.575 m)   Wt 182 lb 9 oz (82.8 kg)   LMP 05/28/1998   SpO2 99%   BMI 33.39 kg/m  Wt Readings from Last 3 Encounters:  06/16/16 182 lb 9 oz (82.8 kg)  12/29/15 169 lb (76.7 kg)  04/13/15 179 lb (81.2 kg)     Lab Results  Component Value Date   WBC 11.4 (H) 12/29/2015   HGB 12.2 12/29/2015   HCT 36.5 12/29/2015   PLT 471.0 (H) 12/29/2015   GLUCOSE 345 (H) 12/29/2015   CHOL 249 (H) 12/29/2015   TRIG 393.0 (H) 12/29/2015   HDL 36.50 (L) 12/29/2015   LDLDIRECT 136.0 12/29/2015   LDLCALC 151 (H) 02/20/2014   ALT 13 12/29/2015   AST 12 12/29/2015   NA 130 (L) 12/29/2015   K 4.6 12/29/2015   CL 96 12/29/2015   CREATININE 0.97 12/29/2015   BUN 29 (H) 12/29/2015   CO2 23 12/29/2015   TSH 3.26 06/10/2014   HGBA1C 12.2 (H) 10/13/2014    Lab Results  Component Value Date   TSH 3.26 06/10/2014   Lab Results  Component Value Date   WBC 11.4 (H) 12/29/2015   HGB 12.2 12/29/2015   HCT 36.5 12/29/2015   MCV 81.2 12/29/2015   PLT 471.0 (H) 12/29/2015   Lab Results  Component Value Date   NA 130 (L) 12/29/2015   K 4.6 12/29/2015   CO2 23 12/29/2015   GLUCOSE 345 (H) 12/29/2015   BUN 29 (H) 12/29/2015   CREATININE 0.97 12/29/2015   BILITOT 0.3 12/29/2015   ALKPHOS 144 (H) 12/29/2015   AST 12 12/29/2015   ALT 13 12/29/2015   PROT 7.7 12/29/2015   ALBUMIN 4.2 12/29/2015   CALCIUM 10.0 12/29/2015   GFR 64.10 12/29/2015   Lab Results  Component Value Date   CHOL 249 (H) 12/29/2015   Lab Results  Component Value Date   HDL 36.50 (L) 12/29/2015   Lab Results  Component Value Date   LDLCALC 151 (H) 02/20/2014   Lab Results  Component Value Date   TRIG 393.0 (H) 12/29/2015   Lab Results  Component Value Date   CHOLHDL 7 12/29/2015    Lab Results  Component Value Date   HGBA1C 12.2 (H) 10/13/2014       Assessment & Plan:   Problem List Items Addressed This Visit    Thyroid disease    On Levothyroxine, continue to monitor      Gastro-esophageal reflux    Try H2 blocker prn, probiotics daily, avoid offending foods.       S/P amputation of lesser toe (HCC)   Anxiety and depression    Is struggling with pain and depression but is stable on Sertraline       Hyperlipidemia, mixed    Encouraged heart healthy diet, increase exercise, avoid trans fats, consider a krill oil cap daily. She agrees to labs and reevaluation at next appt.      Lower limb amputation, great toe (HCC)   Relevant Medications   HYDROcodone-acetaminophen (NORCO) 10-325 MG tablet   S/P BKA (below knee amputation) unilateral (HCC)    S/p on left. Is struggling with ongoing pain will continue pain meds for now and she is following with surgery still as well. Has healed well.       Other Visit Diagnoses   None.     I have discontinued Ms. Buckholtz's aspirin, ONGLYZA, metformin, Vitamin D (Cholecalciferol), doxycycline, and fluconazole. I have also changed her baclofen. Additionally, I am having her maintain her clopidogrel, LEVEMIR FLEXTOUCH, insulin aspart, levothyroxine, sertraline, gabapentin, and HYDROcodone-acetaminophen.  Meds ordered this encounter  Medications  . sertraline (ZOLOFT) 50 MG tablet    Sig: Take 1 tablet (50 mg total) by mouth daily.    Dispense:  30 tablet    Refill:  5  . gabapentin (NEURONTIN)  100 MG capsule    Sig: Take 1 capsule (100 mg total) by mouth 3 (three) times daily.    Dispense:  90 capsule    Refill:  5  . baclofen (LIORESAL) 10 MG tablet    Sig: Take 1 tablet (10 mg total) by mouth 3 (three) times daily as needed for muscle spasms.    Dispense:  60 each    Refill:  5  . HYDROcodone-acetaminophen (NORCO) 10-325 MG tablet    Sig: Take 1 tablet by mouth every 6 (six) hours as needed for moderate  pain (foot, knee, back pain).    Dispense:  90 tablet    Refill:  0     Danise Edge, MD

## 2016-06-26 NOTE — Assessment & Plan Note (Signed)
On Levothyroxine, continue to monitor 

## 2016-08-08 ENCOUNTER — Other Ambulatory Visit: Payer: Self-pay | Admitting: Family Medicine

## 2016-08-08 DIAGNOSIS — S98111A Complete traumatic amputation of right great toe, initial encounter: Secondary | ICD-10-CM

## 2016-08-08 NOTE — Telephone Encounter (Signed)
Requesting: Hydrocodone Contract   10/20/2014 UDS   08/21/2015 low risk, next was due on 02/18/2016 Last OV  06/16/2016 Last Refill   #90 with 0 refills on 06/16/2016  Please Advise

## 2016-08-08 NOTE — Telephone Encounter (Signed)
Caller name: Rodell Pernaatrice Relationship to patient: self Can be reached: [phone]  Reason for call: Pt is out. Usually takes 2/day. Reminded pt to give 72 hr notice on RX refills.

## 2016-08-09 MED ORDER — HYDROCODONE-ACETAMINOPHEN 10-325 MG PO TABS: 1.0000 | ORAL_TABLET | Freq: Four times a day (QID) | ORAL | 0 refills | Status: DC | PRN

## 2016-08-09 NOTE — Telephone Encounter (Signed)
Called the patient informed hardcopy if ready for pickup at the front desk

## 2016-09-02 ENCOUNTER — Inpatient Hospital Stay: Payer: Self-pay | Admitting: Family Medicine

## 2016-10-04 ENCOUNTER — Other Ambulatory Visit: Payer: Self-pay | Admitting: Family Medicine

## 2016-10-04 DIAGNOSIS — S98111A Complete traumatic amputation of right great toe, initial encounter: Secondary | ICD-10-CM

## 2016-10-04 NOTE — Telephone Encounter (Signed)
Ok to give 30 or she can wait for Dr Abner GreenspanBlyth

## 2016-10-04 NOTE — Telephone Encounter (Signed)
Requesting:   Hydrocodone Contract    10/20/2014 UDS    Low risk--is now overdue Last OV    06/16/2016 Last Refill    #90 with 0 refills on 08/19/2016  Please Advise

## 2016-10-04 NOTE — Telephone Encounter (Signed)
Self. [facility]

## 2016-10-05 MED ORDER — HYDROCODONE-ACETAMINOPHEN 10-325 MG PO TABS: 1.0000 | ORAL_TABLET | Freq: Four times a day (QID) | ORAL | 0 refills | Status: DC | PRN

## 2016-10-06 NOTE — Telephone Encounter (Signed)
Patient informed PCP back in the office and can pickup hardcopy at the front desk.

## 2016-11-21 ENCOUNTER — Telehealth: Payer: Self-pay | Admitting: Family Medicine

## 2016-11-21 DIAGNOSIS — S98111A Complete traumatic amputation of right great toe, initial encounter: Secondary | ICD-10-CM

## 2016-11-21 NOTE — Telephone Encounter (Signed)
Relation to ON:GEXBpt:self Call back [phone]   Reason for call:  Patient requesting a refill HYDROcodone-acetaminophen (NORCO) 10-325 MG tablet

## 2016-11-22 ENCOUNTER — Encounter: Payer: Self-pay | Admitting: Family Medicine

## 2016-11-22 MED ORDER — HYDROCODONE-ACETAMINOPHEN 10-325 MG PO TABS: 1.0000 | ORAL_TABLET | Freq: Four times a day (QID) | ORAL | 0 refills | Status: DC | PRN

## 2016-11-22 NOTE — Telephone Encounter (Signed)
OK to refill but update contract and UDS. No further refills til seen, if she needs more before appt will need to be seen

## 2016-11-22 NOTE — Telephone Encounter (Signed)
Requesting:   HYDROCODONE Contract    10/20/2014 UDS     LOW RISK--DUE Last OV     06/16/2016---NEXT SCHEDULED OFFICE VISIT 04/04/2017 Last Refill   #90 WITH NO REFILLS ON 10/20/2014  Please Advise

## 2016-11-22 NOTE — Telephone Encounter (Signed)
Printed hardcopy/Printed contract/called the paitent informed of PCP instructions.  She did verbally agree and stated she has appt schedule already. Attached note to hardcopy/contract and uds to complete.

## 2016-11-24 ENCOUNTER — Encounter: Payer: Medicaid Other | Admitting: Family Medicine

## 2016-11-24 ENCOUNTER — Encounter: Payer: Self-pay | Admitting: Family Medicine

## 2016-11-29 ENCOUNTER — Telehealth: Payer: Self-pay | Admitting: *Deleted

## 2016-11-29 NOTE — Telephone Encounter (Signed)
Caller: self CB #: [phone]  Reason for call: Pt states she had x-ray yesterday and US [facility] through her wound doctor. She states they told her she has a "blood blockage" in her L leg from mid-calf to the ankle. They instructed her to follow-up w/ PCP ASAP.   Her x-ray results are in Care Everywhere, but her US results currently are not. Given that we do not have US results, and pt cannot provide any further information except what is noted above, recommended [facility] for further eval/treatment. Discussed w/ Dr. Zola ButtonLowne-Chase in PCP absence, who agreed w/ plan. Pt notified of instructions and verbalized understanding and agreement w/ plan.

## 2016-12-23 ENCOUNTER — Telehealth: Payer: Self-pay | Admitting: Family Medicine

## 2016-12-23 DIAGNOSIS — S98111A Complete traumatic amputation of right great toe, initial encounter: Secondary | ICD-10-CM

## 2016-12-23 NOTE — Telephone Encounter (Signed)
Requesting:   Hydrocodone Contract   Signed on 11/24/2016 UDS    Low risk and next is due 05/24/2017 Last OV    06/16/2016----next scheduled appt is on 04/04/2017 Last Refill    #90 no refills on 11/22/2016  Please Advise

## 2016-12-23 NOTE — Telephone Encounter (Signed)
°  Relation to ON:GEXBpt:self Call back [phone]   Reason for call:  Patient requesting a refill HYDROcodone-acetaminophen (NORCO) 10-325 MG tablet

## 2016-12-25 NOTE — Telephone Encounter (Signed)
Needs appt since it has been 6 months since visit. Can have #30 to use sparingly til I can se her. She will need to ration them.

## 2016-12-26 MED ORDER — HYDROCODONE-ACETAMINOPHEN 10-325 MG PO TABS: 1.0000 | ORAL_TABLET | Freq: Four times a day (QID) | ORAL | 0 refills | Status: DC | PRN

## 2016-12-26 NOTE — Telephone Encounter (Signed)
Patient informed of hardcopy/PCP instructions. She has appt already in July for CPE

## 2016-12-28 NOTE — Telephone Encounter (Signed)
Will route to PCP to note

## 2016-12-28 NOTE — Telephone Encounter (Signed)
acceptable

## 2016-12-28 NOTE — Telephone Encounter (Signed)
Patients daughter Rolanda LundborgKimberly McPherson came in office to pick up RX for Hydrocodone. Cala BradfordKimberly is not on DPR. DPR states release info to no one. Per Cala BradfordKimberly "I always pick up her prescriptions. She is in a wheelchair." Per Morrie SheldonAshley I checked log book from last month. There is no record of RX from last month being picked up (approx date 11/22/16). I checked 2 weeks before and after date of RX last month. I called pt and she requested that I give RX to her daughter Rolanda LundborgKimberly McPherson and stated that she called yesterday stating her daughter would be the one to pick up. I advised pt that she will need to complete a new DPR at her next OV.

## 2017-01-24 ENCOUNTER — Inpatient Hospital Stay: Payer: Self-pay | Admitting: Medical

## 2017-02-20 ENCOUNTER — Inpatient Hospital Stay: Payer: Self-pay | Admitting: Family Medicine

## 2017-03-14 ENCOUNTER — Encounter: Payer: Self-pay | Admitting: Family Medicine

## 2017-03-14 ENCOUNTER — Ambulatory Visit (INDEPENDENT_AMBULATORY_CARE_PROVIDER_SITE_OTHER): Payer: Medicaid Other | Admitting: Family Medicine

## 2017-03-14 VITALS — BP 147/62 | HR 95 | Temp 98.6°F | Resp 18 | Wt 172.0 lb

## 2017-03-14 DIAGNOSIS — F419 Anxiety disorder, unspecified: Secondary | ICD-10-CM

## 2017-03-14 DIAGNOSIS — E11628 Type 2 diabetes mellitus with other skin complications: Secondary | ICD-10-CM

## 2017-03-14 DIAGNOSIS — E1165 Type 2 diabetes mellitus with hyperglycemia: Secondary | ICD-10-CM

## 2017-03-14 DIAGNOSIS — F329 Major depressive disorder, single episode, unspecified: Secondary | ICD-10-CM | POA: Diagnosis not present

## 2017-03-14 DIAGNOSIS — Z89411 Acquired absence of right great toe: Secondary | ICD-10-CM | POA: Diagnosis not present

## 2017-03-14 DIAGNOSIS — R259 Unspecified abnormal involuntary movements: Secondary | ICD-10-CM | POA: Diagnosis not present

## 2017-03-14 DIAGNOSIS — Z89619 Acquired absence of unspecified leg above knee: Secondary | ICD-10-CM | POA: Diagnosis not present

## 2017-03-14 DIAGNOSIS — L089 Local infection of the skin and subcutaneous tissue, unspecified: Secondary | ICD-10-CM

## 2017-03-14 DIAGNOSIS — S98111A Complete traumatic amputation of right great toe, initial encounter: Secondary | ICD-10-CM

## 2017-03-14 DIAGNOSIS — E118 Type 2 diabetes mellitus with unspecified complications: Secondary | ICD-10-CM

## 2017-03-14 DIAGNOSIS — E782 Mixed hyperlipidemia: Secondary | ICD-10-CM

## 2017-03-14 MED ORDER — HYDROCODONE-ACETAMINOPHEN 10-325 MG PO TABS: 1.0000 | ORAL_TABLET | Freq: Four times a day (QID) | ORAL | 0 refills | Status: DC | PRN

## 2017-03-14 MED ORDER — SERTRALINE HCL 50 MG PO TABS: 50.0000 mg | ORAL_TABLET | Freq: Every day | ORAL | 5 refills | Status: DC

## 2017-03-14 MED ORDER — GABAPENTIN 400 MG PO CAPS: 400.0000 mg | ORAL_CAPSULE | Freq: Three times a day (TID) | ORAL | 5 refills | Status: DC

## 2017-03-14 MED ORDER — BACLOFEN 10 MG PO TABS: 10.0000 mg | ORAL_TABLET | Freq: Three times a day (TID) | ORAL | 5 refills | Status: AC | PRN

## 2017-03-14 NOTE — Assessment & Plan Note (Signed)
[facility] Then had a stent in rehab she feels better no s/p b/l amputation.

## 2017-03-14 NOTE — Assessment & Plan Note (Addendum)
minimize simple carbs. Increase exercise as tolerated.  

## 2017-03-14 NOTE — Progress Notes (Signed)
Subjective:  I acted as a Neurosurgeon for Dr. Abner Greenspan. [place], [place]  Patient ID: Dawn Cabrera, female    DOB: 05-21-1966, 51 y.o.   MRN: [id number]  Chief Complaint  Patient presents with  . Medication follow up    HPI  Patient is in today for a medication follow up. Patietn c/o both hands moving with out notice at times. No recent febrile illness or acute hospitalizations. Denies CP/palp/SOB/HA/congestion/fevers/GI or GU c/o. Taking meds as prescribed. hgba1c acceptable, minimize simple carbs. Increase exercise as tolerated. Continue current meds. Sees her endocrinologist tomorrow. She continues to struggle with back and lower extremity pain.  No polyuria or polydisPatient Care Team: Bradd Canary, MD as PCP - General (Family Medicine) Susa Raring., MD as Consulting Physician (Sports Medicine) Eber Jones, MD as Referring Physician (Ophthalmology) Ocie Cornfield, MD as Referring Physician (Internal Medicine) Ronda Fairly, MD (Infectious Diseases) Leone Payor., MD (Surgery)   Past Medical History:  Diagnosis Date  . Chicken pox as child  . DDD (degenerative disc disease)    low back pain  . Diabetes (HCC) 10/13/2013  . Diabetes mellitus without complication (HCC) 10 years   type 2   . Hx of leg amputation (HCC) 11/24/2014  . Hyperlipidemia   . Involuntary movements 03/19/2017  . Other and unspecified hyperlipidemia 06/15/2014  . Polycystic ovarian syndrome 99  . Preventative health care 10/26/2014  . PVD (peripheral vascular disease) (HCC) 01/01/2014   Right leg S/p 3 stents in right leg at [facility] in March of 2015  . S/P amputation of lesser toe (HCC) 01/01/2014  . S/P BKA (below knee amputation) unilateral (HCC) 04/13/2015   Dr Lucianne Lei orthopaedic surgeon follows closely with patient every 2 weeks.    . 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 Thyroid disease   . Ulcer   . UTI (urinary tract infection) 10/26/2014    Past Surgical History:    Procedure Laterality Date  . ABDOMINAL EXPLORATION SURGERY  1999   before hysterectomy  . ABDOMINAL HYSTERECTOMY  05-28-98   total  . CESAREAN SECTION  10-10-85  . EYE SURGERY  10-15-14  . TOE AMPUTATION Right 12/18/13  . VASCULAR SURGERY  08-05-14,09-03-14    Family History  Problem Relation Age of Onset  . COPD Mother 66  . Diabetes Mother        type 2  . Alcohol abuse Mother   . Thyroid disease Daughter   . Cancer Maternal Grandmother        unsure  . Alcohol abuse Maternal Grandmother   . Alcohol abuse Father   . Alcohol abuse Brother   . Alcohol abuse Maternal Grandfather   . Alcohol abuse Brother   . Alcohol abuse Brother     Social History   Social History  . Marital status: Divorced    Spouse name: N/A  . Number of children: N/A  . Years of education: N/A   Occupational History  . Not on file.   Social History Main Topics  . Smoking status: Former Smoker    Packs/day: 0.50    Years: 34.00    Types: Cigarettes    Quit date: 12/18/2013  . Smokeless tobacco: Never Used  . Alcohol use No  . Drug use: No  . Sexual activity: Yes    Partners: Male   Other Topics Concern  . Not on file   Social History Narrative  . No narrative on file    Outpatient  Medications Prior to Visit  Medication Sig Dispense Refill  . clopidogrel (PLAVIX) 75 MG tablet Take 1 tablet (75 mg total) by mouth once. 90 tablet 1  . insulin aspart (NOVOLOG) 100 UNIT/ML injection Pt states she takes as needed per sliding scale.    Marland Kitchen LEVEMIR FLEXTOUCH 100 UNIT/ML Pen INFECT 50 UNITS INTO THE SKIN DAILY AT 10 PM (Patient taking differently: INFECT 54 UNITS INTO THE SKIN DAILY AT 10 PM) 15 mL 2  . levothyroxine (SYNTHROID, LEVOTHROID) 200 MCG tablet TAKE ONE TABLET BY MOUTH ONCE DAILY BEFORE BREAKFAST 30 tablet 0  . baclofen (LIORESAL) 10 MG tablet Take 1 tablet (10 mg total) by mouth 3 (three) times daily as needed for muscle spasms. 60 each 5  . gabapentin (NEURONTIN) 100 MG capsule Take 1  capsule (100 mg total) by mouth 3 (three) times daily. 90 capsule 5  . HYDROcodone-acetaminophen (NORCO) 10-325 MG tablet Take 1 tablet by mouth every 6 (six) hours as needed for moderate pain (foot, knee, back pain). 90 tablet 0  . sertraline (ZOLOFT) 50 MG tablet Take 1 tablet (50 mg total) by mouth daily. 30 tablet 5   No facility-administered medications prior to visit.     Allergies  Allergen Reactions  . Fenofibrate Anaphylaxis    myalgia  . Livalo [Pitavastatin] Other (See Comments)    Body aches    Review of Systems  Constitutional: Negative for fever and malaise/fatigue.  HENT: Negative for congestion.   Eyes: Negative for blurred vision.  Respiratory: Negative for shortness of breath.   Cardiovascular: Negative for chest pain, palpitations and leg swelling.  Gastrointestinal: Negative for abdominal pain, blood in stool and nausea.  Genitourinary: Negative for dysuria and frequency.  Musculoskeletal: Positive for joint pain. Negative for falls.  Skin: Negative for rash.  Neurological: Positive for tremors. Negative for dizziness, loss of consciousness and headaches.  Endo/Heme/Allergies: Negative for environmental allergies.  Psychiatric/Behavioral: Positive for depression. The patient is nervous/anxious.        Objective:    Physical Exam  Constitutional: She is oriented to person, place, and time. She appears well-developed and well-nourished. No distress.  HENT:  Head: Normocephalic and atraumatic.  Nose: Nose normal.  Eyes: Right eye exhibits no discharge. Left eye exhibits no discharge.  Neck: Normal range of motion. Neck supple.  Cardiovascular: Normal rate and regular rhythm.   No murmur heard. Pulmonary/Chest: Effort normal and breath sounds normal.  Abdominal: Soft. Bowel sounds are normal. There is no tenderness.  Musculoskeletal: She exhibits no edema.  B/l BKA  Neurological: She is alert and oriented to person, place, and time.  Skin: Skin is warm  and dry.  Psychiatric: She has a normal mood and affect.  Nursing note and vitals reviewed.   BP (!) 147/62 (BP Location: Left Arm, Patient Position: Sitting, Cuff Size: Normal)   Pulse 95   Temp 98.6 F (37 C) (Oral)   Resp 18   Wt 172 lb (78 kg)   LMP 05/28/1998   SpO2 100%   BMI 31.46 kg/m  Wt Readings from Last 3 Encounters:  03/14/17 172 lb (78 kg)  06/16/16 182 lb 9 oz (82.8 kg)  12/29/15 169 lb (76.7 kg)   BP Readings from Last 3 Encounters:  03/14/17 (!) 147/62  06/16/16 (!) 142/80  12/29/15 98/60     Immunization History  Administered Date(s) Administered  . Td 10/04/1999    Health Maintenance  Topic Date Due  . Hepatitis C Screening  Mar 13, 1966  .  PNEUMOCOCCAL POLYSACCHARIDE VACCINE (1) 12/26/1965  . FOOT EXAM  12/26/1973  . OPHTHALMOLOGY EXAM  12/26/1973  . URINE MICROALBUMIN  12/26/1973  . HIV Screening  12/27/1978  . TETANUS/TDAP  10/03/2009  . MAMMOGRAM  12/26/2013  . COLONOSCOPY  12/26/2013  . HEMOGLOBIN A1C  04/13/2015  . INFLUENZA VACCINE  07/17/2017 (Originally 05/03/2017)  . PAP SMEAR  10/20/2017    Lab Results  Component Value Date   WBC 11.4 (H) 12/29/2015   HGB 12.2 12/29/2015   HCT 36.5 12/29/2015   PLT 471.0 (H) 12/29/2015   GLUCOSE 345 (H) 12/29/2015   CHOL 249 (H) 12/29/2015   TRIG 393.0 (H) 12/29/2015   HDL 36.50 (L) 12/29/2015   LDLDIRECT 136.0 12/29/2015   LDLCALC 151 (H) 02/20/2014   ALT 13 12/29/2015   AST 12 12/29/2015   NA 130 (L) 12/29/2015   K 4.6 12/29/2015   CL 96 12/29/2015   CREATININE 0.97 12/29/2015   BUN 29 (H) 12/29/2015   CO2 23 12/29/2015   TSH 3.26 06/10/2014   HGBA1C 12.2 (H) 10/13/2014    Lab Results  Component Value Date   TSH 3.26 06/10/2014   Lab Results  Component Value Date   WBC 11.4 (H) 12/29/2015   HGB 12.2 12/29/2015   HCT 36.5 12/29/2015   MCV 81.2 12/29/2015   PLT 471.0 (H) 12/29/2015   Lab Results  Component Value Date   NA 130 (L) 12/29/2015   K 4.6 12/29/2015   CO2 23  12/29/2015   GLUCOSE 345 (H) 12/29/2015   BUN 29 (H) 12/29/2015   CREATININE 0.97 12/29/2015   BILITOT 0.3 12/29/2015   ALKPHOS 144 (H) 12/29/2015   AST 12 12/29/2015   ALT 13 12/29/2015   PROT 7.7 12/29/2015   ALBUMIN 4.2 12/29/2015   CALCIUM 10.0 12/29/2015   GFR 64.10 12/29/2015   Lab Results  Component Value Date   CHOL 249 (H) 12/29/2015   Lab Results  Component Value Date   HDL 36.50 (L) 12/29/2015   Lab Results  Component Value Date   LDLCALC 151 (H) 02/20/2014   Lab Results  Component Value Date   TRIG 393.0 (H) 12/29/2015   Lab Results  Component Value Date   CHOLHDL 7 12/29/2015   Lab Results  Component Value Date   HGBA1C 12.2 (H) 10/13/2014         Assessment & Plan:   Problem List Items Addressed This Visit    Diabetic foot infection (HCC)    Unfortunately is now s/p b/l lower extremity amputations and awaiting prothesis      Diabetes type 2, uncontrolled (HCC)    minimize simple carbs. Increase exercise as tolerated.       Anxiety and depression    Sertraline 50 mg daily      Hyperlipidemia, mixed    Encouraged heart healthy diet, increase exercise, avoid trans fats, consider a krill oil cap daily      Hx of leg amputation (HCC)    [facility] Then had a stent in rehab she feels better no s/p b/l amputation.       Relevant Medications   HYDROcodone-acetaminophen (NORCO) 10-325 MG tablet   Involuntary movements    Of b/l hands increasing over several months. Is referred to neurology for further consideration       Other Visit Diagnoses    Abnormal movements    -  Primary   Relevant Orders   Ambulatory referral to Neurology  Lower limb amputation, great toe, right (HCC)       Relevant Medications   HYDROcodone-acetaminophen (NORCO) 10-325 MG tablet      I have discontinued Ms. Boulos's gabapentin. I have also changed her gabapentin. Additionally, I am having her maintain her clopidogrel, LEVEMIR  FLEXTOUCH, insulin aspart, levothyroxine, baclofen, HYDROcodone-acetaminophen, and sertraline.  Meds ordered this encounter  Medications  . DISCONTD: gabapentin (NEURONTIN) 400 MG capsule    Sig: Take 400 mg by mouth 3 (three) times daily.  Marland Kitchen gabapentin (NEURONTIN) 400 MG capsule    Sig: Take 1 capsule (400 mg total) by mouth 3 (three) times daily.    Dispense:  90 capsule    Refill:  5  . baclofen (LIORESAL) 10 MG tablet    Sig: Take 1 tablet (10 mg total) by mouth 3 (three) times daily as needed for muscle spasms.    Dispense:  60 each    Refill:  5  . HYDROcodone-acetaminophen (NORCO) 10-325 MG tablet    Sig: Take 1 tablet by mouth every 6 (six) hours as needed for moderate pain (foot, knee, back pain).    Dispense:  60 tablet    Refill:  0  . sertraline (ZOLOFT) 50 MG tablet    Sig: Take 1 tablet (50 mg total) by mouth daily.    Dispense:  30 tablet    Refill:  5    CMA served as scribe during this visit. History, Physical and Plan performed by medical provider. Documentation and orders reviewed and attested to.  Danise Edge, MD

## 2017-03-14 NOTE — Patient Instructions (Addendum)
Neutrogena T gel or [place] Blue wash area with these 2-3 x a week   Carbohydrate Counting for Diabetes Mellitus, Adult Carbohydrate counting is a method for keeping track of how many carbohydrates you eat. Eating carbohydrates naturally increases the amount of sugar (glucose) in the blood. Counting how many carbohydrates you eat helps keep your blood glucose within normal limits, which helps you manage your diabetes (diabetes mellitus). It is important to know how many carbohydrates you can safely have in each meal. This is different for every person. A diet and nutrition specialist (registered dietitian) can help you make a meal plan and calculate how many carbohydrates you should have at each meal and snack. Carbohydrates are found in the following foods:  Grains, such as breads and cereals.  Dried beans and soy products.  Starchy vegetables, such as potatoes, peas, and corn.  Fruit and fruit juices.  Milk and yogurt.  Sweets and snack foods, such as cake, cookies, candy, chips, and soft drinks.  How do I count carbohydrates? There are two ways to count carbohydrates in food. You can use either of the methods or a combination of both. Reading "Nutrition Facts" on packaged food The "Nutrition Facts" list is included on the labels of almost all packaged foods and beverages in the [place] It includes:  The serving size.  Information about nutrients in each serving, including the grams (g) of carbohydrate per serving.  To use the "Nutrition Facts":  Decide how many servings you will have.  Multiply the number of servings by the number of carbohydrates per serving.  The resulting number is the total amount of carbohydrates that you will be having.  Learning standard serving sizes of other foods When you eat foods containing carbohydrates that are not packaged or do not include "Nutrition Facts" on the label, you need to measure the servings in order to count the amount of  carbohydrates:  Measure the foods that you will eat with a food scale or measuring cup, if needed.  Decide how many standard-size servings you will eat.  Multiply the number of servings by 15. Most carbohydrate-rich foods have about 15 g of carbohydrates per serving. ? For example, if you eat 8 oz (170 g) of strawberries, you will have eaten 2 servings and 30 g of carbohydrates (2 servings x 15 g = 30 g).  For foods that have more than one food mixed, such as soups and casseroles, you must count the carbohydrates in each food that is included.  The following list contains standard serving sizes of common carbohydrate-rich foods. Each of these servings has about 15 g of carbohydrates:   hamburger bun or  English muffin.   oz (15 mL) syrup.   oz (14 g) jelly.  1 slice of bread.  1 six-inch tortilla.  3 oz (85 g) cooked rice or pasta.  4 oz (113 g) cooked dried beans.  4 oz (113 g) starchy vegetable, such as peas, corn, or potatoes.  4 oz (113 g) hot cereal.  4 oz (113 g) mashed potatoes or  of a large baked potato.  4 oz (113 g) canned or frozen fruit.  4 oz (120 mL) fruit juice.  4-6 crackers.  6 chicken nuggets.  6 oz (170 g) unsweetened dry cereal.  6 oz (170 g) plain fat-free yogurt or yogurt sweetened with artificial sweeteners.  8 oz (240 mL) milk.  8 oz (170 g) fresh fruit or one small piece of fruit.  24 oz (680 g)  popped popcorn.  Example of carbohydrate counting Sample meal  3 oz (85 g) chicken breast.  6 oz (170 g) brown rice.  4 oz (113 g) corn.  8 oz (240 mL) milk.  8 oz (170 g) strawberries with sugar-free whipped topping. Carbohydrate calculation 1. Identify the foods that contain carbohydrates: ? Rice. ? Corn. ? Milk. ? Strawberries. 2. Calculate how many servings you have of each food: ? 2 servings rice. ? 1 serving corn. ? 1 serving milk. ? 1 serving strawberries. 3. Multiply each number of servings by 15 g: ? 2 servings  rice x 15 g = 30 g. ? 1 serving corn x 15 g = 15 g. ? 1 serving milk x 15 g = 15 g. ? 1 serving strawberries x 15 g = 15 g. 4. Add together all of the amounts to find the total grams of carbohydrates eaten: ? 30 g + 15 g + 15 g + 15 g = 75 g of carbohydrates total. This information is not intended to replace advice given to you by your health care provider. Make sure you discuss any questions you have with your health care provider. Document Released: 09/19/2005 Document Revised: 04/08/2016 Document Reviewed: 03/02/2016 Elsevier Interactive Patient Education  [employer].

## 2017-03-19 ENCOUNTER — Encounter: Payer: Self-pay | Admitting: Family Medicine

## 2017-03-19 DIAGNOSIS — R259 Unspecified abnormal involuntary movements: Secondary | ICD-10-CM

## 2017-03-19 HISTORY — DX: Unspecified abnormal involuntary movements: R25.9

## 2017-03-19 NOTE — Assessment & Plan Note (Signed)
Of b/l hands increasing over several months. Is referred to neurology for further consideration

## 2017-03-19 NOTE — Assessment & Plan Note (Signed)
Sertraline 50 mg daily

## 2017-03-19 NOTE — Assessment & Plan Note (Signed)
Unfortunately is now s/p b/l lower extremity amputations and awaiting prothesis

## 2017-03-19 NOTE — Assessment & Plan Note (Signed)
Encouraged heart healthy diet, increase exercise, avoid trans fats, consider a krill oil cap daily 

## 2017-04-04 ENCOUNTER — Encounter: Payer: Self-pay | Admitting: Family Medicine

## 2017-05-17 ENCOUNTER — Other Ambulatory Visit: Payer: Self-pay | Admitting: Family Medicine

## 2017-05-17 DIAGNOSIS — S98111A Complete traumatic amputation of right great toe, initial encounter: Secondary | ICD-10-CM

## 2017-05-17 NOTE — Telephone Encounter (Signed)
Self.   [facility]   CB: [phone]   Please call when ready pt's daughter will be in to pick up.

## 2017-05-18 MED ORDER — HYDROCODONE-ACETAMINOPHEN 10-325 MG PO TABS: 1.0000 | ORAL_TABLET | Freq: Four times a day (QID) | ORAL | 0 refills | Status: AC | PRN

## 2017-05-18 NOTE — Telephone Encounter (Signed)
Patient notified rx was ready for pick up. She stated her daughter will come get it. Patient is wheelchair bound until she has her new leg.    Pc

## 2017-05-18 NOTE — Telephone Encounter (Signed)
Requesting: Norco Contract: Yes UDS: low risk next screen 05/24/17 Last OV: 03/14/17 Last Refill: 03/14/17  #[id number]  Please Advise

## 2017-06-15 ENCOUNTER — Ambulatory Visit: Payer: Medicaid Other | Admitting: Family Medicine

## 2017-07-03 ENCOUNTER — Telehealth: Payer: Self-pay | Admitting: Family Medicine

## 2017-07-03 DIAGNOSIS — Z1239 Encounter for other screening for malignant neoplasm of breast: Secondary | ICD-10-CM

## 2017-07-03 DIAGNOSIS — N63 Unspecified lump in unspecified breast: Secondary | ICD-10-CM

## 2017-07-03 NOTE — Telephone Encounter (Signed)
Orders already placed.

## 2017-07-03 NOTE — Telephone Encounter (Signed)
Pt found lump in left breast two nights ago. Pt request mammogram that is more detailed. Please order one at [facility] at [place] [phone] per pt request. Call pt [phone].

## 2017-07-03 NOTE — Telephone Encounter (Signed)
perfect

## 2017-07-04 ENCOUNTER — Ambulatory Visit: Payer: Medicaid Other | Admitting: Family Medicine

## 2017-07-05 ENCOUNTER — Ambulatory Visit: Payer: Medicaid Other | Admitting: Family Medicine

## 2017-07-07 LAB — HM MAMMOGRAPHY: HM Mammogram: ABNORMAL — AB (ref 0–4)

## 2017-10-05 ENCOUNTER — Telehealth: Payer: Self-pay | Admitting: *Deleted

## 2017-10-05 NOTE — Telephone Encounter (Signed)
Received [facility] Mamm[facility]. Bilateral, and US Breast Left Limited results from [facility];; forwarded to provider/SLS 01/03

## 2017-10-10 ENCOUNTER — Ambulatory Visit: Payer: Medicaid Other | Admitting: Family Medicine

## 2017-10-13 ENCOUNTER — Encounter: Payer: Self-pay | Admitting: Family Medicine

## 2017-10-13 NOTE — Progress Notes (Signed)
See scanned paperwork  

## 2017-11-23 ENCOUNTER — Other Ambulatory Visit: Payer: Self-pay | Admitting: Family Medicine

## 2018-04-02 ENCOUNTER — Other Ambulatory Visit: Payer: Self-pay | Admitting: Family Medicine

## 2018-06-19 ENCOUNTER — Other Ambulatory Visit: Payer: Self-pay | Admitting: Family Medicine

## 2019-02-01 ENCOUNTER — Other Ambulatory Visit: Payer: Self-pay | Admitting: Family Medicine
